# Patient Record
Sex: Male | Born: 1990 | Race: White | Hispanic: No | Marital: Single | Smoking: Never smoker
Health system: Southern US, Community
[De-identification: ages and names within clinical notes are randomized; demographics above are authoritative.]

## PROBLEM LIST (undated history)

## (undated) DIAGNOSIS — Z8781 Personal history of (healed) traumatic fracture: Secondary | ICD-10-CM

## (undated) HISTORY — DX: Personal history of (healed) traumatic fracture: Z87.81

## (undated) HISTORY — PX: HAND SURGERY: SHX662

---
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
## 2011-04-04 ENCOUNTER — Ambulatory Visit (INDEPENDENT_AMBULATORY_CARE_PROVIDER_SITE_OTHER): Payer: BC Managed Care – PPO | Admitting: Pediatrics

## 2011-04-04 VITALS — Wt 138.0 lb

## 2011-04-04 DIAGNOSIS — Z23 Encounter for immunization: Secondary | ICD-10-CM

## 2011-04-04 NOTE — Progress Notes (Signed)
Presented today for Hep B l vaccine.  Was sent by employer for booster vaccine since Hep B Ab level was 0.4 and immunity level was 0.99. Counseled that this level is not representative of his hep B immune status since he received the vaccines in 1993/1994 and titers are not routinely indicated after 3 doses of Hep B vaccine. Says he would like to follow the request of the employer and have titers done after a booster dose. No new questions on vaccine. Mom was counseled on risks benefits of vaccine and mom vaccine and mom verbalized understanding. Will return in 1-2 months for Hep B Ab titers. At that time he can receive his 2nd hep A and will draw LFT and HIV if requested.

## 2011-05-31 ENCOUNTER — Ambulatory Visit (INDEPENDENT_AMBULATORY_CARE_PROVIDER_SITE_OTHER): Payer: BC Managed Care – PPO | Admitting: Pediatrics

## 2011-05-31 DIAGNOSIS — Z23 Encounter for immunization: Secondary | ICD-10-CM

## 2011-05-31 DIAGNOSIS — Z003 Encounter for examination for adolescent development state: Secondary | ICD-10-CM

## 2011-06-03 NOTE — Progress Notes (Signed)
Seen for eval hepB titers low, [facility]. Given hepB booster, needs titer. Also getting flu shots and labs discussed. Has had 2 hep A

## 2012-01-08 ENCOUNTER — Encounter (HOSPITAL_BASED_OUTPATIENT_CLINIC_OR_DEPARTMENT_OTHER): Payer: Self-pay | Admitting: *Deleted

## 2012-01-08 ENCOUNTER — Emergency Department (HOSPITAL_BASED_OUTPATIENT_CLINIC_OR_DEPARTMENT_OTHER)
Admission: EM | Admit: 2012-01-08 | Discharge: 2012-01-08 | Disposition: A | Discharge status: Home / Self Care | Payer: BC Managed Care – PPO | Attending: Emergency Medicine | Admitting: Emergency Medicine

## 2012-01-08 DIAGNOSIS — R21 Rash and other nonspecific skin eruption: Secondary | ICD-10-CM | POA: Insufficient documentation

## 2012-01-08 MED ORDER — PERMETHRIN 5 % EX CREA: TOPICAL_CREAM | CUTANEOUS | Status: AC

## 2012-01-08 MED ORDER — DIPHENHYDRAMINE HCL 50 MG PO CAPS
50.0000 mg | ORAL_CAPSULE | Freq: Once | ORAL | Status: AC
  Administered 2012-01-08: 50 mg via ORAL
  Filled 2012-01-08: qty 1

## 2012-01-08 MED ORDER — DIPHENHYDRAMINE HCL 25 MG PO CAPS
ORAL_CAPSULE | ORAL | Status: AC
  Filled 2012-01-08: qty 2

## 2012-01-08 MED ORDER — DIPHENHYDRAMINE HCL 25 MG PO TABS: 50.0000 mg | ORAL_TABLET | Freq: Four times a day (QID) | ORAL | Status: DC

## 2012-01-08 NOTE — ED Notes (Signed)
Pt c/o rash to all extr. X 4 days

## 2012-01-08 NOTE — ED Provider Notes (Signed)
History     CSN: [id number]  Arrival date & time 01/08/12  2111   First MD Initiated Contact with Patient 01/08/12 2216      Chief Complaint  Patient presents with  . Rash    (Consider location/radiation/quality/duration/timing/severity/associated sxs/prior treatment) HPI Pt presenting with itchy rash over legs and arms.  Rash began last week- he first noted it on his lower legs in his boots.  He works as a Administrator.  Has tried calamine lotion, nail polish due to thought of chiggers, bleach- all without much help.  No sob, no lip/tongue swelling.   History reviewed. No pertinent past medical history.  History reviewed. No pertinent past surgical history.  History reviewed. No pertinent family history.  History  Substance Use Topics  . Smoking status: Never Smoker   . Smokeless tobacco: Not on file  . Alcohol Use: No      Review of Systems ROS reviewed and all otherwise negative except for mentioned in HPI  Allergies  Review of patient's allergies indicates no known allergies.  Home Medications   Current Outpatient Rx  Name Route Sig Dispense Refill  . CALAMINE EX LOTN Topical Apply 1 application topically as needed. Patient used this medication for itching.    Marland Kitchen CETIRIZINE HCL 10 MG PO TABS Oral Take 10 mg by mouth daily.    Marland Kitchen DIPHENHYDRAMINE HCL 25 MG PO TABS Oral Take 2 tablets (50 mg total) by mouth every 6 (six) hours. Take 1-2 tablets every 6 hours x 2 days, then space out to an as needed basis 20 tablet 0  . PERMETHRIN 5 % EX CREA  Apply to affected area once- leave on for 8-12 hours, then rinse off- may repeat x 1 60 g 0    BP 120/44  Pulse 63  Temp(Src) 98.2 F (36.8 C) (Oral)  Resp 16  Ht 5\' 9"  (1.753 m)  Wt 140 lb (63.504 kg)  BMI 20.67 kg/m2  SpO2 100% Vitals reviewed Physical Exam Physical Examination: General appearance - alert, well appearing, and in no distress Eyes - pupils equal and reactive, extraocular eye movements intact Mouth -  mucous membranes moist, pharynx normal without lesions Chest - clear to auscultation, no wheezes, rales or rhonchi, symmetric air entry Heart - normal rate, regular rhythm, normal S1, S2, no murmurs, rubs, clicks or gallops Abdomen - soft, nontender, nondistended, no masses or organomegaly Extremities - peripheral pulses normal, no pedal edema, no clubbing or cyanosis Skin - normal coloration and turgor, papular erythematous rash over arms and legs, chest- with overlying excorations.  No petechiae  ED Course  Procedures (including critical care time)  Labs Reviewed - No data to display No results found.   1. 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  Pt treated with benadryl and permethrin cream- for possibility of scabies.  He was counseled to try this treatment and arrange for follow up with primary care if rash does not improve.         Ethelda Chick, MD 01/11/12 [id number]

## 2012-01-08 NOTE — ED Notes (Signed)
Pt with le rash bilaterally, +uticaria, rash to bilateral le and bilateral upper extremeties, pt states that vesicles on arms are popping, pt has been applying calamine lotion with no relief

## 2012-01-08 NOTE — Discharge Instructions (Signed)
Return to the ED with any concerns including dificulty breathing, fever, pus draining from wounds, decreased level of alertness/lethargy, or any other alarming symptoms

## 2013-04-30 ENCOUNTER — Emergency Department (HOSPITAL_BASED_OUTPATIENT_CLINIC_OR_DEPARTMENT_OTHER)
Admission: EM | Admit: 2013-04-30 | Discharge: 2013-04-30 | Disposition: A | Discharge status: Home / Self Care | Payer: BC Managed Care – PPO | Attending: Emergency Medicine | Admitting: Emergency Medicine

## 2013-04-30 ENCOUNTER — Emergency Department (HOSPITAL_BASED_OUTPATIENT_CLINIC_OR_DEPARTMENT_OTHER): Payer: BC Managed Care – PPO

## 2013-04-30 ENCOUNTER — Encounter (HOSPITAL_BASED_OUTPATIENT_CLINIC_OR_DEPARTMENT_OTHER): Payer: Self-pay | Admitting: *Deleted

## 2013-04-30 DIAGNOSIS — S62308A Unspecified fracture of other metacarpal bone, initial encounter for closed fracture: Secondary | ICD-10-CM

## 2013-04-30 DIAGNOSIS — Z79899 Other long term (current) drug therapy: Secondary | ICD-10-CM | POA: Insufficient documentation

## 2013-04-30 DIAGNOSIS — Y929 Unspecified place or not applicable: Secondary | ICD-10-CM | POA: Insufficient documentation

## 2013-04-30 DIAGNOSIS — Y9389 Activity, other specified: Secondary | ICD-10-CM | POA: Insufficient documentation

## 2013-04-30 DIAGNOSIS — IMO0002 Reserved for concepts with insufficient information to code with codable children: Secondary | ICD-10-CM | POA: Insufficient documentation

## 2013-04-30 DIAGNOSIS — S62329A Displaced fracture of shaft of unspecified metacarpal bone, initial encounter for closed fracture: Secondary | ICD-10-CM | POA: Insufficient documentation

## 2013-04-30 MED ORDER — OXYCODONE-ACETAMINOPHEN 5-325 MG PO TABS: 1.0000 | ORAL_TABLET | Freq: Four times a day (QID) | ORAL | Status: DC | PRN

## 2013-04-30 NOTE — ED Notes (Signed)
Hit wall with right hand last pm having swelling some deformity noted postive pulses distally

## 2013-04-30 NOTE — ED Provider Notes (Signed)
CSN: [id number]     Arrival date & time 04/30/13  1224 History   First MD Initiated Contact with Patient 04/30/13 1254     Chief Complaint  Patient presents with  . Hand Pain   (Consider location/radiation/quality/duration/timing/severity/associated sxs/prior Treatment) Patient is a 22 y.o. male presenting with hand injury. The history is provided by the patient.  Hand Injury Location:  Hand Time since incident:  1 day Injury: yes   Mechanism of injury comment:  Punchged a wall Hand location:  R hand Pain details:    Quality:  Dull   Radiates to:  Does not radiate   Severity:  Mild   Onset quality:  Sudden   Duration:  1 day   Timing:  Constant   Progression:  Unchanged Chronicity:  New Handedness:  Right-handed Foreign body present:  No foreign bodies Tetanus status:  Up to date Prior injury to area:  No Relieved by:  Nothing Worsened by:  Nothing tried Ineffective treatments:  None tried Associated symptoms: no fever and no neck pain     History reviewed. No pertinent past medical history. History reviewed. No pertinent past surgical history. History reviewed. No pertinent family history. History  Substance Use Topics  . Smoking status: Never Smoker   . Smokeless tobacco: Not on file  . Alcohol Use: No    Review of Systems  Constitutional: Negative for fever.  HENT: Negative for rhinorrhea, drooling and neck pain.   Eyes: Negative for pain.  Respiratory: Negative for cough and shortness of breath.   Cardiovascular: Negative for chest pain and leg swelling.  Gastrointestinal: Negative for nausea, vomiting, abdominal pain and diarrhea.  Genitourinary: Negative for dysuria and hematuria.  Musculoskeletal: Negative for gait problem.  Skin: Negative for color change.  Neurological: Negative for numbness and headaches.  Hematological: Negative for adenopathy.  Psychiatric/Behavioral: Negative for behavioral problems.  All other systems reviewed and are  negative.    Allergies  Review of patient's allergies indicates no known allergies.  Home Medications   Current Outpatient Rx  Name  Route  Sig  Dispense  Refill  . calamine lotion   Topical   Apply 1 application topically as needed. Patient used this medication for itching.         . cetirizine (ZYRTEC) 10 MG tablet   Oral   Take 10 mg by mouth daily.         Marland Kitchen EXPIRED: diphenhydrAMINE (BENADRYL) 25 MG tablet   Oral   Take 2 tablets (50 mg total) by mouth every 6 (six) hours. Take 1-2 tablets every 6 hours x 2 days, then space out to an as needed basis   20 tablet   0    BP 133/64  Temp(Src) 98.4 F (36.9 C) (Oral)  SpO2 99% Physical Exam  Nursing note and vitals reviewed. Constitutional: He is oriented to person, place, and time. He appears well-developed and well-nourished.  HENT:  Head: Normocephalic and atraumatic.  Right Ear: External ear normal.  Left Ear: External ear normal.  Nose: Nose normal.  Mouth/Throat: Oropharynx is clear and moist. No oropharyngeal exudate.  Eyes: Conjunctivae and EOM are normal. Pupils are equal, round, and reactive to light.  Neck: Normal range of motion. Neck supple.  Cardiovascular: Normal rate, regular rhythm, normal heart sounds and intact distal pulses.  Exam reveals no gallop and no friction rub.   No murmur heard. Pulmonary/Chest: Effort normal and breath sounds normal. No respiratory distress. He has no wheezes.  Abdominal: Soft. Bowel  sounds are normal. He exhibits no distension. There is no tenderness. There is no rebound and no guarding.  Musculoskeletal: Normal range of motion. He exhibits no edema and no tenderness.  Mild swelling over right hand, 5th metacarpal area w/ mild to mod ttp.   Mild abrasion over 4th metacarpal head area.   Mild rotational deformity of 5th digit noted w/ digits of right hand flexed.   2+ distal pulses. No motor deficit noted in right hand.   Neurological: He is alert and oriented to  person, place, and time.  Skin: Skin is warm and dry.  Psychiatric: He has a normal mood and affect. His behavior is normal.    ED Course  Procedures (including critical care time) Labs Review Labs Reviewed - No data to display Imaging Review Dg Hand Complete Right  04/30/2013   CLINICAL DATA:  Patient states hit a wall last night. Posterior pain and swelling  EXAM: RIGHT HAND - COMPLETE 3+ VIEW  COMPARISON:  None.  FINDINGS: Three views of right hand submitted. There is mild angulated fracture mid shaft of right 5th metacarpal.  IMPRESSION: Mild angulated fracture of right 5th metacarpal mid shaft.   Electronically Signed   By: Natasha Mead   On: 04/30/2013 13:18    MDM   1. Closed fracture of 5th metacarpal, initial encounter    1:14 PM 22 y.o. male presents with right hand pain after punching a wall last night. Patient has a gross deformity to the area of the fifth metacarpal. He is otherwise neurovascularly intact on exam. Will get imaging. The patient refuses any pain medicine. Will speak to Hydrographic surveyor.  2:06 PM: Will place in ulnar gutter and try to manipulate splint to hold pressure on the fracture.  I have discussed the diagnosis/risks/treatment options with the patient and family and believe the pt to be eligible for discharge home to call Dr. Ronnell Freshwater office today so he can get early f/u next week. We also discussed returning to the ED immediately if new or worsening sx occur. We discussed the sx which are most concerning (e.g., worsening pain) that necessitate immediate return. Any new prescriptions provided to the patient are listed below.  Discharge Medication List as of 04/30/2013  2:08 PM    START taking these medications   Details  oxyCODONE-acetaminophen (PERCOCET) 5-325 MG per tablet Take 1 tablet by mouth every 6 (six) hours as needed for pain., Starting 04/30/2013, Until Discontinued, Print         Junius Argyle, MD 05/01/13 1355

## 2013-05-04 ENCOUNTER — Ambulatory Visit: Payer: Self-pay | Admitting: Pediatrics

## 2015-05-11 ENCOUNTER — Encounter (HOSPITAL_BASED_OUTPATIENT_CLINIC_OR_DEPARTMENT_OTHER): Payer: Self-pay | Admitting: Emergency Medicine

## 2015-05-11 ENCOUNTER — Emergency Department (HOSPITAL_BASED_OUTPATIENT_CLINIC_OR_DEPARTMENT_OTHER)
Admission: EM | Admit: 2015-05-11 | Discharge: 2015-05-11 | Disposition: A | Discharge status: Home / Self Care | Payer: BLUE CROSS/BLUE SHIELD | Attending: Emergency Medicine | Admitting: Emergency Medicine

## 2015-05-11 DIAGNOSIS — W228XXA Striking against or struck by other objects, initial encounter: Secondary | ICD-10-CM | POA: Diagnosis not present

## 2015-05-11 DIAGNOSIS — Z79899 Other long term (current) drug therapy: Secondary | ICD-10-CM | POA: Diagnosis not present

## 2015-05-11 DIAGNOSIS — Y998 Other external cause status: Secondary | ICD-10-CM | POA: Diagnosis not present

## 2015-05-11 DIAGNOSIS — Y9389 Activity, other specified: Secondary | ICD-10-CM | POA: Insufficient documentation

## 2015-05-11 DIAGNOSIS — Y9289 Other specified places as the place of occurrence of the external cause: Secondary | ICD-10-CM | POA: Insufficient documentation

## 2015-05-11 DIAGNOSIS — S0501XA Injury of conjunctiva and corneal abrasion without foreign body, right eye, initial encounter: Secondary | ICD-10-CM | POA: Diagnosis not present

## 2015-05-11 DIAGNOSIS — S0591XA Unspecified injury of right eye and orbit, initial encounter: Secondary | ICD-10-CM | POA: Diagnosis present

## 2015-05-11 MED ORDER — POLYMYXIN B-TRIMETHOPRIM 10000-0.1 UNIT/ML-% OP SOLN: 1.0000 [drp] | OPHTHALMIC | Status: DC

## 2015-05-11 MED ORDER — TETRACAINE HCL 0.5 % OP SOLN
2.0000 [drp] | Freq: Once | OPHTHALMIC | Status: AC
  Administered 2015-05-11: 2 [drp] via OPHTHALMIC
  Filled 2015-05-11: qty 2

## 2015-05-11 MED ORDER — FLUORESCEIN SODIUM 1 MG OP STRP
1.0000 | ORAL_STRIP | Freq: Once | OPHTHALMIC | Status: AC
  Administered 2015-05-11: 1 via OPHTHALMIC
  Filled 2015-05-11: qty 1

## 2015-05-11 NOTE — Discharge Instructions (Signed)
Make an appointment to see the ophthalmologist with contact information below. Do not hesitate to return to the emergency room for any new, worsening or concerning symptoms.  Carmela Rima [address] [place] [address] [phone]   Corneal Abrasion The cornea is the clear covering at the front and center of the eye. When looking at the colored portion of the eye (iris), you are looking through the cornea. This very thin tissue is made up of many layers. The surface layer is a single layer of cells (corneal epithelium) and is one of the most sensitive tissues in the body. If a scratch or injury causes the corneal epithelium to come off, it is called a corneal abrasion. If the injury extends to the tissues below the epithelium, the condition is called a corneal ulcer. CAUSES   Scratches.  Trauma.  Foreign body in the eye. Some people have recurrences of abrasions in the area of the original injury even after it has healed (recurrent erosion syndrome). Recurrent erosion syndrome generally improves and goes away with time. SYMPTOMS   Eye pain.  Difficulty or inability to keep the injured eye open.  The eye becomes very sensitive to light.  Recurrent erosions tend to happen suddenly, first thing in the morning, usually after waking up and opening the eye. DIAGNOSIS  Your health care provider can diagnose a corneal abrasion during an eye exam. Dye is usually placed in the eye using a drop or a small paper strip moistened by your tears. When the eye is examined with a special light, the abrasion shows up clearly because of the dye. TREATMENT   Small abrasions may be treated with antibiotic drops or ointment alone.  A pressure patch may be put over the eye. If this is done, follow your doctor's instructions for when to remove the patch. Do not drive or use machines while the eye patch is on. Judging distances is hard to do with a patch on. If the abrasion becomes  infected and spreads to the deeper tissues of the cornea, a corneal ulcer can result. This is serious because it can cause corneal scarring. Corneal scars interfere with light passing through the cornea and cause a loss of vision in the involved eye. HOME CARE INSTRUCTIONS  Use medicine or ointment as directed. Only take over-the-counter or prescription medicines for pain, discomfort, or fever as directed by your health care provider.  Do not drive or operate machinery if your eye is patched. Your ability to judge distances is impaired.  If your health care provider has given you a follow-up appointment, it is very important to keep that appointment. Not keeping the appointment could result in a severe eye infection or permanent loss of vision. If there is any problem keeping the appointment, let your health care provider know. SEEK MEDICAL CARE IF:   You have pain, light sensitivity, and a scratchy feeling in one eye or both eyes.  Your pressure patch keeps loosening up, and you can blink your eye under the patch after treatment.  Any kind of discharge develops from the eye after treatment or if the lids stick together in the morning.  You have the same symptoms in the morning as you did with the original abrasion days, weeks, or months after the abrasion healed. MAKE SURE YOU:   Understand these instructions.  Will watch your condition.  Will get help right away if you are not doing well or get worse. Document Released: 08/02/2000 Document Revised: 08/10/2013  Document Reviewed: 04/12/2013 [facility] Patient Information 2015 [place], [place]. This information is not intended to replace advice given to you by your health care provider. Make sure you discuss any questions you have with your health care provider.

## 2015-05-11 NOTE — ED Provider Notes (Signed)
CSN: [id number]     Arrival date & time 05/11/15  1810 History   First MD Initiated Contact with Patient 05/11/15 1816     Chief Complaint  Patient presents with  . Eye Injury     (Consider location/radiation/quality/duration/timing/severity/associated sxs/prior Treatment) HPI   Blood pressure 131/73, pulse 74, temperature 98.4 F (36.9 C), temperature source Oral, resp. rate 16, height  (1.702 m), weight 160 lb (72.576 kg), SpO2 100 %.  Phillip Cabrera is a 24 y.o. male complaining of right eye injury. At work today, weed eating, when a rock hit him in the eye. Rock hit the globe directly, he was not wearing safety glasses at the time. Following initial impact, had difficulty seeing out of right eye for 15 minutes, and some associated nausea. Now reports blurry vision in peripheral visual fields. Pain is 3/10 with intermittent 5/10 throbbing sensation. Slight photosensitivity and positive foreign body sensation. No relief of symptoms with rinsing the eye. Currently denies headache, nausea, or vomiting. He does not wear glasses or contacts.   History reviewed. No pertinent past medical history. History reviewed. No pertinent past surgical history. No family history on file. Social History  Substance Use Topics  . Smoking status: Never Smoker   . Smokeless tobacco: None  . Alcohol Use: No    Review of Systems  10 systems reviewed and found to be negative, except as noted in the HPI.  Allergies  Review of patient's allergies indicates no known allergies.  Home Medications   Prior to Admission medications   Medication Sig Start Date End Date Taking? Authorizing Provider  calamine lotion Apply 1 application topically as needed. Patient used this medication for itching.    Historical Provider, MD  cetirizine (ZYRTEC) 10 MG tablet Take 10 mg by mouth daily.    Historical Provider, MD  diphenhydrAMINE (BENADRYL) 25 MG tablet Take 2 tablets (50 mg total) by mouth every 6 (six)  hours. Take 1-2 tablets every 6 hours x 2 days, then space out to an as needed basis 01/08/12 02/07/12  Jerelyn Scott, MD  oxyCODONE-acetaminophen (PERCOCET) 5-325 MG per tablet Take 1 tablet by mouth every 6 (six) hours as needed for pain. 04/30/13   Purvis Sheffield, MD  trimethoprim-polymyxin b (POLYTRIM) ophthalmic solution Place 1 drop into the right eye every 4 (four) hours. 05/11/15   Nicole Pisciotta, PA-C   BP 131/73 mmHg  Pulse 74  Temp(Src) 98.4 F (36.9 C) (Oral)  Resp 16  Ht  (1.702 m)  Wt 160 lb (72.576 kg)  BMI 25.05 kg/m2  SpO2 100% Physical Exam  Constitutional: He is oriented to person, place, and time. He appears well-developed and well-nourished. No distress.  HENT:  Head: Normocephalic and atraumatic.  Mouth/Throat: Oropharynx is clear and moist.  Eyes: EOM and lids are normal. Pupils are equal, round, and reactive to light. Lids are everted and swept, no foreign bodies found. Right conjunctiva is injected.  Slit lamp exam:      The right eye shows corneal abrasion.    Conjunctival injection noted in right eye. 20/40 right, 20/20 left. EOMI with no pain. Decreased right temporal visual fields.  Fluorescein eye stain reveals corneal abrasion, anterior chamber otherwise unremarkable.   Neck: Normal range of motion.  Cardiovascular: Normal rate, regular rhythm and intact distal pulses.   Pulmonary/Chest: Effort normal and breath sounds normal. No stridor.  Abdominal: Soft. There is no tenderness.  Musculoskeletal: Normal range of motion.  Neurological: He is alert and oriented to  person, place, and time.  Skin: He is not diaphoretic.  Psychiatric: He has a normal mood and affect.  Nursing note and vitals reviewed.   ED Course  Procedures (including critical care time) Labs Review Labs Reviewed - No data to display  Imaging Review No results found. I have personally reviewed and evaluated these images and lab results as part of my medical  decision-making.   EKG Interpretation None      MDM   Final diagnoses:  Corneal abrasion, right, initial encounter    Filed Vitals:   05/11/15 1814  BP: 131/73  Pulse: 74  Temp: 98.4 F (36.9 C)  TempSrc: Oral  Resp: 16  Height:  (1.702 m)  Weight: 160 lb (72.576 kg)  SpO2: 100%    Medications  fluorescein ophthalmic strip 1 strip (1 strip Both Eyes Given 05/11/15 1836)  tetracaine (PONTOCAINE) 0.5 % ophthalmic solution 2 drop (2 drops Right Eye Given 05/11/15 1900)    Phillip Cabrera is a pleasant 24 y.o. male presenting with pain foreign body sensation and blurred vision to right eye after patient was weed eating and felt an impact on the right. Patient has visual acuity of 20/40. Slit lamp exam reveals a corneal abrasion right on the visual axis. Patient will be given prescription for Polytrim and advised to follow closely with ophthalmology.  Evaluation does not show pathology that would require ongoing emergent intervention or inpatient treatment. Pt is hemodynamically stable and mentating appropriately. Discussed findings and plan with patient/guardian, who agrees with care plan. All questions answered. Return precautions discussed and outpatient follow up given.   New Prescriptions   TRIMETHOPRIM-POLYMYXIN B (POLYTRIM) OPHTHALMIC SOLUTION    Place 1 drop into the right eye every 4 (four) hours.         Wynetta Emery, PA-C 05/11/15 1944  Geoffery Lyons, MD 05/11/15 2011

## 2015-05-11 NOTE — ED Notes (Signed)
Felt like something in eye after weed eating today. Redness noted. Pt has rinsed eye out.

## 2016-08-09 ENCOUNTER — Encounter (HOSPITAL_BASED_OUTPATIENT_CLINIC_OR_DEPARTMENT_OTHER): Payer: Self-pay | Admitting: *Deleted

## 2016-08-09 ENCOUNTER — Emergency Department (HOSPITAL_BASED_OUTPATIENT_CLINIC_OR_DEPARTMENT_OTHER)
Admission: EM | Admit: 2016-08-09 | Discharge: 2016-08-09 | Disposition: A | Discharge status: Home / Self Care | Payer: BLUE CROSS/BLUE SHIELD | Attending: Emergency Medicine | Admitting: Emergency Medicine

## 2016-08-09 DIAGNOSIS — K529 Noninfective gastroenteritis and colitis, unspecified: Secondary | ICD-10-CM | POA: Diagnosis not present

## 2016-08-09 DIAGNOSIS — R112 Nausea with vomiting, unspecified: Secondary | ICD-10-CM | POA: Diagnosis present

## 2016-08-09 DIAGNOSIS — F17290 Nicotine dependence, other tobacco product, uncomplicated: Secondary | ICD-10-CM | POA: Diagnosis not present

## 2016-08-09 LAB — COMPREHENSIVE METABOLIC PANEL
ALK PHOS: 62 U/L (ref 38–126)
ALT: 20 U/L (ref 17–63)
AST: 29 U/L (ref 15–41)
Albumin: 4.6 g/dL (ref 3.5–5.0)
Anion gap: 11 (ref 5–15)
BILIRUBIN TOTAL: 1.1 mg/dL (ref 0.3–1.2)
BUN: 18 mg/dL (ref 6–20)
CALCIUM: 9.7 mg/dL (ref 8.9–10.3)
CO2: 23 mmol/L (ref 22–32)
CREATININE: 0.92 mg/dL (ref 0.61–1.24)
Chloride: 105 mmol/L (ref 101–111)
GFR calc Af Amer: >60 mL/min (ref >60)
GLUCOSE: 107 mg/dL — AB (ref 65–99)
Potassium: 3.5 mmol/L (ref 3.5–5.1)
Sodium: 139 mmol/L (ref 135–145)
TOTAL PROTEIN: 7.3 g/dL (ref 6.5–8.1)

## 2016-08-09 LAB — CBC WITH DIFFERENTIAL/PLATELET
BASOS ABS: 0 10*3/uL (ref 0.0–0.1)
BASOS PCT: 0 %
EOS PCT: 0 %
Eosinophils Absolute: 0 10*3/uL (ref 0.0–0.7)
HCT: 44.6 % (ref 39.0–52.0)
Hemoglobin: 15.4 g/dL (ref 13.0–17.0)
Lymphocytes Relative: 4 %
Lymphs Abs: 0.4 10*3/uL — ABNORMAL LOW (ref 0.7–4.0)
MCH: 30.9 pg (ref 26.0–34.0)
MCHC: 34.5 g/dL (ref 30.0–36.0)
MCV: 89.4 fL (ref 78.0–100.0)
MONO ABS: 0.5 10*3/uL (ref 0.1–1.0)
MONOS PCT: 4 %
Neutro Abs: 10.8 10*3/uL — ABNORMAL HIGH (ref 1.7–7.7)
Neutrophils Relative %: 92 %
PLATELETS: 210 10*3/uL (ref 150–400)
RBC: 4.99 MIL/uL (ref 4.22–5.81)
RDW: 11.4 % — AB (ref 11.5–15.5)
WBC: 11.8 10*3/uL — ABNORMAL HIGH (ref 4.0–10.5)

## 2016-08-09 LAB — LIPASE, BLOOD: LIPASE: 19 U/L (ref 11–51)

## 2016-08-09 MED ORDER — SODIUM CHLORIDE 0.9 % IV BOLUS (SEPSIS)
2000.0000 mL | Freq: Once | INTRAVENOUS | Status: AC
  Administered 2016-08-09: 2000 mL via INTRAVENOUS

## 2016-08-09 MED ORDER — ONDANSETRON HCL 4 MG/2ML IJ SOLN
4.0000 mg | Freq: Once | INTRAMUSCULAR | Status: AC
  Administered 2016-08-09: 4 mg via INTRAVENOUS
  Filled 2016-08-09: qty 2

## 2016-08-09 MED ORDER — ONDANSETRON HCL 4 MG PO TABS: 4.0000 mg | ORAL_TABLET | Freq: Three times a day (TID) | ORAL | 0 refills | Status: DC | PRN

## 2016-08-09 MED ORDER — ONDANSETRON 4 MG PO TBDP
4.0000 mg | ORAL_TABLET | Freq: Once | ORAL | Status: AC | PRN
  Administered 2016-08-09: 4 mg via ORAL
  Filled 2016-08-09: qty 1

## 2016-08-09 NOTE — ED Notes (Signed)
ED Provider at bedside. 

## 2016-08-09 NOTE — ED Notes (Signed)
Pt verbalizes understanding of d/c instructions and denies any further needs at this time. 

## 2016-08-09 NOTE — ED Provider Notes (Signed)
[facility] Provider Note   CSN: [id number] Arrival date & time: 08/09/16  1713  By signing my name below, I, Phillip Cabrera, attest that this documentation has been prepared under the direction and in the presence of physician practitioner, Phillip LovelessScott Eligah Anello, MD. Electronically Signed: Linna Darnerussell Cabrera, Scribe. 08/09/2016. 7:18 PM.  History   Chief Complaint Chief Complaint  Patient presents with  . Shortness of Breath  . Emesis    The history is provided by the patient. No language interpreter was used.     HPI Comments: Phillip Cabrera is a 25 y.o. male who presents to the Emergency Department complaining of an insect bite occurring around 10 AM this morning. He states he was mowing lawns and felt a biting/stinging sensation to his right thigh; he notes he did not see the insect. He reports he had some mild pain and swelling to his right thigh afterwards but these symptoms have resolved. He states that a few hours after he was bitten, he developed intermittent nausea, vomiting, diarrhea, dizziness, generalized myalgias, abdominal cramps, chills, SOB, and tingling in his upper extremities. He notes he feels dizzy currently. He states he has had "a bunch" of episodes of vomiting and diarrhea this afternoon. He states his abdominal pain is intermittent and he reports diaphoresis secondary to his vomiting. He states he was short of breath and had tingling in his upper extremities in the waiting room tonight but these symptoms have since resolved. No recent unusual food intake. He denies dysuria, hematuria, fever, cough, leg swelling, hematemesis, or any other associated symptoms.  History reviewed. No pertinent past medical history.  Patient Active Problem List   Diagnosis Date Noted  . Closed fracture of 5th metacarpal 04/30/2013    History reviewed. No pertinent surgical history.     Home Medications    Prior to Admission medications   Medication Sig Start Date End Date  Taking? Authorizing Provider  calamine lotion Apply 1 application topically as needed. Patient used this medication for itching.    Historical Provider, MD  cetirizine (ZYRTEC) 10 MG tablet Take 10 mg by mouth daily.    Historical Provider, MD  diphenhydrAMINE (BENADRYL) 25 MG tablet Take 2 tablets (50 mg total) by mouth every 6 (six) hours. Take 1-2 tablets every 6 hours x 2 days, then space out to an as needed basis 01/08/12 02/07/12  Phillip ScottMartha Linker, MD  ondansetron (ZOFRAN) 4 MG tablet Take 1 tablet (4 mg total) by mouth every 8 (eight) hours as needed for nausea or vomiting. 08/09/16   Phillip LovelessScott Phillip Lacap, MD  oxyCODONE-acetaminophen (PERCOCET) 5-325 MG per tablet Take 1 tablet by mouth every 6 (six) hours as needed for pain. 04/30/13   Purvis SheffieldForrest Harrison, MD  trimethoprim-polymyxin b (POLYTRIM) ophthalmic solution Place 1 drop into the right eye every 4 (four) hours. 05/11/15   Phillip ReiningNicole Pisciotta, PA-C    Family History No family history on file.  Social History Social History  Substance Use Topics  . Smoking status: Never Smoker  . Smokeless tobacco: Current User    Types: Snuff  . Alcohol use Yes     Comment: occasional     Allergies   Patient has no known allergies.   Review of Systems Review of Systems  Constitutional: Positive for chills and diaphoresis. Negative for fever.  Respiratory: Positive for shortness of breath. Negative for cough.   Cardiovascular: Negative for leg swelling.  Gastrointestinal: Positive for abdominal pain, diarrhea, nausea and vomiting.       Negative for  hematemesis.  Genitourinary: Negative for dysuria and hematuria.  Musculoskeletal: Positive for joint swelling (right thigh, resolved) and myalgias (generalized).  Skin: Positive for wound (insect bite).  Neurological: Positive for dizziness and numbness (tingling in upper extremities).  All other systems reviewed and are negative.    Physical Exam Updated Vital Signs BP (!) 121/53   Pulse 83    Temp 98.8 F (37.1 C)   Resp 18   Ht 5\' 8"  (1.727 m)   Wt 155 lb (70.3 kg)   SpO2 100%   BMI 23.57 kg/m   Physical Exam  Constitutional: He is oriented to person, place, and time. He appears well-developed and well-nourished. No distress.  HENT:  Head: Normocephalic and atraumatic.  Right Ear: External ear normal.  Left Ear: External ear normal.  Nose: Nose normal.  Eyes: Right eye exhibits no discharge. Left eye exhibits no discharge.  Neck: Neck supple.  Cardiovascular: Normal rate, regular rhythm and normal heart sounds.   Pulmonary/Chest: Effort normal and breath sounds normal. He has no wheezes. He has no rales.  Abdominal: Soft. There is tenderness.  Mild, generalized, non-focal tenderness  Musculoskeletal: He exhibits no edema.  No visible lesions, swelling, or tenderness in the right thigh.  Neurological: He is alert and oriented to person, place, and time.  Skin: Skin is warm and dry. He is not diaphoretic.  Nursing note and vitals reviewed.    ED Treatments / Results  Labs (all labs ordered are listed, but only abnormal results are displayed) Labs Reviewed  COMPREHENSIVE METABOLIC PANEL - Abnormal; Notable for the following:       Result Value   Glucose, Bld 107 (*)    All other components within normal limits  CBC WITH DIFFERENTIAL/PLATELET - Abnormal; Notable for the following:    WBC 11.8 (*)    RDW 11.4 (*)    Neutro Abs 10.8 (*)    Lymphs Abs 0.4 (*)    All other components within normal limits  LIPASE, BLOOD    EKG  EKG Interpretation None       Radiology No results found.  Procedures Procedures (including critical care time)  DIAGNOSTIC STUDIES: Oxygen Saturation is 100% on RA, normal by my interpretation.    COORDINATION OF CARE: 7:27 PM Discussed treatment plan with pt at bedside and pt agreed to plan.  Medications Ordered in ED Medications  ondansetron (ZOFRAN-ODT) disintegrating tablet 4 mg (4 mg Oral Given 08/09/16 1736)    sodium chloride 0.9 % bolus 2,000 mL (0 mLs Intravenous Stopped 08/09/16 2107)  ondansetron (ZOFRAN) injection 4 mg (4 mg Intravenous Given 08/09/16 1940)     Initial Impression / Assessment and Plan / ED Course  I have reviewed the triage vital signs and the nursing notes.  Pertinent labs & imaging results that were available during my care of the patient were reviewed by me and considered in my medical decision making (see chart for details).  Clinical Course as of Aug 10 8  Fri Aug 09, 2016  1927 No obvious lesions on R leg, likely inconsequential bug bite. Fluids for what is likely a viral gastroenteritis. No current dyspnea, unclear what to make of transient dyspnea, probably related to the vomiting/diarrhea/dehydration.  [SG]  2055 Feels better, d/c home  [SG]    Clinical Course User Index [SG] Phillip LovelessScott Jerold Yoss, MD    Abdominal exam has only mild tenderness that is probably from vomiting/diarrhea. Highly doubt acute abdominal emergency. Better after fluids. Unclear why he had transient dyspnea  but with no current symptoms, no CP, clear lungs and no hypoxia I don't think further workup needed now. Could have been from acute dehydration. Zofran for discharge, push oral fluids, discussed return precautions.   Final Clinical Impressions(s) / ED Diagnoses   Final diagnoses:  Gastroenteritis    New Prescriptions Discharge Medication List as of 08/09/2016  8:54 PM    START taking these medications   Details  ondansetron (ZOFRAN) 4 MG tablet Take 1 tablet (4 mg total) by mouth every 8 (eight) hours as needed for nausea or vomiting., Starting Fri 08/09/2016, Print       I personally performed the services described in this documentation, which was scribed in my presence. The recorded information has been reviewed and is accurate.    Phillip Loveless, MD 08/10/16 0010

## 2016-08-09 NOTE — ED Triage Notes (Signed)
Pt reports he had on "holey jeans and something bit him". C/o pain and swelling to right thigh. Also reports he vomited 5 times and has had diarrhea since then. Pt states he feels SOB. Hands cramping in triage

## 2018-06-10 ENCOUNTER — Other Ambulatory Visit: Payer: Self-pay

## 2018-06-10 ENCOUNTER — Emergency Department (HOSPITAL_BASED_OUTPATIENT_CLINIC_OR_DEPARTMENT_OTHER)
Admission: EM | Admit: 2018-06-10 | Discharge: 2018-06-10 | Disposition: A | Discharge status: Home / Self Care | Payer: Worker's Compensation | Attending: Emergency Medicine | Admitting: Emergency Medicine

## 2018-06-10 ENCOUNTER — Encounter (HOSPITAL_BASED_OUTPATIENT_CLINIC_OR_DEPARTMENT_OTHER): Payer: Self-pay

## 2018-06-10 ENCOUNTER — Emergency Department (HOSPITAL_BASED_OUTPATIENT_CLINIC_OR_DEPARTMENT_OTHER): Payer: Worker's Compensation

## 2018-06-10 DIAGNOSIS — W01198A Fall on same level from slipping, tripping and stumbling with subsequent striking against other object, initial encounter: Secondary | ICD-10-CM | POA: Insufficient documentation

## 2018-06-10 DIAGNOSIS — Y929 Unspecified place or not applicable: Secondary | ICD-10-CM | POA: Insufficient documentation

## 2018-06-10 DIAGNOSIS — Y9389 Activity, other specified: Secondary | ICD-10-CM | POA: Insufficient documentation

## 2018-06-10 DIAGNOSIS — Y998 Other external cause status: Secondary | ICD-10-CM | POA: Diagnosis not present

## 2018-06-10 DIAGNOSIS — Z79899 Other long term (current) drug therapy: Secondary | ICD-10-CM | POA: Diagnosis not present

## 2018-06-10 DIAGNOSIS — W19XXXA Unspecified fall, initial encounter: Secondary | ICD-10-CM

## 2018-06-10 DIAGNOSIS — S62232A Other displaced fracture of base of first metacarpal bone, left hand, initial encounter for closed fracture: Secondary | ICD-10-CM | POA: Insufficient documentation

## 2018-06-10 DIAGNOSIS — S6992XA Unspecified injury of left wrist, hand and finger(s), initial encounter: Secondary | ICD-10-CM | POA: Diagnosis present

## 2018-06-10 MED ORDER — OXYCODONE-ACETAMINOPHEN 5-325 MG PO TABS: 1.0000 | ORAL_TABLET | Freq: Four times a day (QID) | ORAL | 0 refills | Status: DC | PRN

## 2018-06-10 MED ORDER — NAPROXEN 500 MG PO TABS: 500.0000 mg | ORAL_TABLET | Freq: Two times a day (BID) | ORAL | 0 refills | Status: DC

## 2018-06-10 MED ORDER — NAPROXEN 250 MG PO TABS
500.0000 mg | ORAL_TABLET | Freq: Once | ORAL | Status: AC
  Administered 2018-06-10: 500 mg via ORAL
  Filled 2018-06-10: qty 2

## 2018-06-10 NOTE — ED Triage Notes (Addendum)
Pt states he fell at work ~3pm-pain to left thumb and right shoulder-NAD-steady gait

## 2018-06-10 NOTE — Discharge Instructions (Signed)
Please read and follow all provided instructions.  You have been seen today for a fall and found to have a fracture of the base of the first metacarpal of your left hand, otherwise noticed a fracture to the base of the thumb.  We have placed you in a splint, it is important that you keep your hand in the splint until you have seen hand surgeon Dr. Mina Marble.  Please call his office tomorrow morning in order to schedule appointment within the next 1 week.  In the interim please follow the instructions below.  Tests performed today include: An x-ray of the affected area - does NOT show any broken bones or dislocations.  Vital signs. See below for your results today.   Home care instructions: -- *PRICE in the first 24-48 hours after injury: Protect (with brace, splint, sling), if given by your provider- keep this on until you have seen Dr. Clarisse Gouge- Apply to outside of splint wrapped in a towel. Apply ice for 20 min, then remove for 40 min while awake Compression- Wear brace, elastic bandage, splint as directed by your provider Elevate affected extremity above the level of your heart when not walking around for the first 24-48 hours   Medications:  - Naproxen is a nonsteroidal anti-inflammatory medication that will help with pain and swelling. Be sure to take this medication as prescribed with food, 1 pill every 12 hours,  It should be taken with food, as it can cause stomach upset, and more seriously, stomach bleeding. Do not take other nonsteroidal anti-inflammatory medications with this such as Advil, Motrin, Aleve, Mobic, Goodie Powder, or Motrin.    --Percocet-this is a narcotic/controlled substance medication that has potential addicting qualities.  We recommend that you take 1 tablet every 6 hours as needed for severe pain.  Do not drive or operate heavy machinery when taking this medicine as it can be sedating. Do not drink alcohol or take other sedating medications when taking this  medicine for safety reasons.  Keep this out of reach of small children.  Please be aware this medicine has Tylenol in it (325 mg/tab) do not exceed the maximum dose of Tylenol in a day per over the counter recommendations should you decide to supplement with Tylenol over the counter.   We have prescribed you new medication(s) today. Discuss the medications prescribed today with your pharmacist as they can have adverse effects and interactions with your other medicines including over the counter and prescribed medications. Seek medical evaluation if you start to experience new or abnormal symptoms after taking one of these medicines, seek care immediately if you start to experience difficulty breathing, feeling of your throat closing, facial swelling, or rash as these could be indications of a more serious allergic reaction  Return instructions:  Please return if your digits or extremity are numb or tingling, appear gray or blue, or you have severe pain (also elevate the extremity and loosen splint or wrap if you were given one) Please return if you have redness or fevers.  Please return to the Emergency Department if you experience worsening symptoms.  Please return if you have any other emergent concerns. Additional Information:  Your vital signs today were: BP 131/64 (BP Location: Left Arm)    Pulse 65    Temp 97.9 F (36.6 C) (Oral)    Resp 18    Ht 5\' 8"  (1.727 m)    Wt 76.2 kg    SpO2 100%    BMI 25.54  kg/m  If your blood pressure (BP) was elevated above 135/85 this visit, please have this repeated by your doctor within one month. ---------------

## 2018-06-10 NOTE — ED Provider Notes (Signed)
[facility] EMERGENCY DEPARTMENT Provider Note   CSN: [id number] Arrival date & time: 06/10/18  1710     History   Chief Complaint Chief Complaint  Patient presents with  . Fall    HPI Phillip Cabrera is a 27 y.o. male without significant past medical history who presents to the emergency department status post mechanical fall this afternoon with complaints of left thumb pain as well as right shoulder pain.  Patient states that he was walking and following a lawnmower, he grabbed onto the handle and it pulled him forward causing him to fall.  He states that he fell onto his right elbow and rolled over onto his left thumb.  He denies head injury or loss of consciousness.  He states that he is only having pain to the left thumb and the right shoulder area, however the pain is most significant to the thumb.  He states it is a 3 out of 10 at rest that worsens with movement.  No other specific alleviating or aggravating factors.  Denies headache, neck pain, back pain, numbness, weakness, paresthesias.  Denies open wounds.  Patient is right-hand dominant.  HPI  History reviewed. No pertinent past medical history.  Patient Active Problem List   Diagnosis Date Noted  . Closed fracture of 5th metacarpal 04/30/2013    Past Surgical History:  Procedure Laterality Date  . HAND SURGERY          Home Medications    Prior to Admission medications   Medication Sig Start Date End Date Taking? Authorizing Provider  calamine lotion Apply 1 application topically as needed. Patient used this medication for itching.    [provider]  cetirizine (ZYRTEC) 10 MG tablet Take 10 mg by mouth daily.    [provider]  diphenhydrAMINE (BENADRYL) 25 MG tablet Take 2 tablets (50 mg total) by mouth every 6 (six) hours. Take 1-2 tablets every 6 hours x 2 days, then space out to an as needed basis 01/08/12 02/07/12  Mabe, Latanya Maudlin, MD  ondansetron (ZOFRAN) 4 MG tablet Take 1 tablet  (4 mg total) by mouth every 8 (eight) hours as needed for nausea or vomiting. 08/09/16   Pricilla Loveless, MD    Family History No family history on file.  Social History Social History   Tobacco Use  . Smoking status: Never Smoker  . Smokeless tobacco: Former Neurosurgeon    Types: Snuff  Substance Use Topics  . Alcohol use: Not Currently  . Drug use: No     Allergies   Patient has no known allergies.   Review of Systems Review of Systems  Constitutional: Negative for chills and fever.  Respiratory: Negative for shortness of breath.   Cardiovascular: Negative for chest pain.  Gastrointestinal: Negative for abdominal pain.  Musculoskeletal: Positive for arthralgias (R shoulder and L thumb). Negative for back pain and neck pain.  Skin: Negative for rash and wound.  Neurological: Negative for weakness, numbness and headaches.     Physical Exam Updated Vital Signs BP 131/64 (BP Location: Left Arm)   Pulse 65   Temp 97.9 F (36.6 C) (Oral)   Resp 18   Ht 5\' 8"  (1.727 m)   Wt 76.2 kg   SpO2 100%   BMI 25.54 kg/m   Physical Exam  Constitutional: He appears well-developed and well-nourished.  Non-toxic appearance. No distress.  HENT:  Head: Normocephalic and atraumatic.  Right Ear: No hemotympanum.  Left Ear: No hemotympanum.  Nose: Nose normal.  Mouth/Throat: Uvula is midline.  Eyes: Pupils are equal, round, and reactive to light. Conjunctivae and EOM are normal. Right eye exhibits no discharge. Left eye exhibits no discharge.  Neck: Normal range of motion. Neck supple. No spinous process tenderness present.  Cardiovascular: Normal rate and regular rhythm.  Pulses:      Radial pulses are 2+ on the right side, and 2+ on the left side.  Pulmonary/Chest: Effort normal and breath sounds normal. No respiratory distress. He has no wheezes. He has no rhonchi. He has no rales.  Respiration even and unlabored  Abdominal: Soft. He exhibits no distension. There is no tenderness.    Musculoskeletal:  No obvious deformity, appreciable swelling, erythema, ecchymosis, or significant open wounds Back: No midline tenderness to palpation.  No palpable step-off. Upper extremities: Patient has full active range of motion to bilateral shoulders, elbows, wrist, and all digits.  He does have pain with left thumb flexion, extension, and abduction.  Patient with some tenderness over the diffuse right shoulder and right posterior elbow.  No point/focal tenderness to these areas.  No tenderness the medial/lateral epicondyles or the olecranon or of the radial head.  The right upper extremity is otherwise nontender.  Patient does have tenderness to palpation to the left 1st phalanges, IP joint, MCP, as well as the first metacarpal and anatomical snuffbox area.  No tenderness to the distal radius or ulna.  Left upper extremities otherwise nontender.  He is neurovascularly intact distal to all areas of discomfort. Lower extremities: Normal range of motion.  Nontender.  Neurological: He is alert.  Sensation grossly intact bilateral upper extremities.  5 out of 5 symmetric grip strength.  Patient able to perform okay sign, thumbs up, and cross second/third digits bilaterally.Clear speech.   Skin: Skin is warm and dry. Capillary refill takes less than 2 seconds. No rash noted.  Psychiatric: He has a normal mood and affect. His behavior is normal.  Nursing note and vitals reviewed.  ED Treatments / Results  Labs (all labs ordered are listed, but only abnormal results are displayed) Labs Reviewed - No data to display  EKG None  Radiology Dg Shoulder Right  Result Date: 06/10/2018 CLINICAL DATA:  Fall with right shoulder pain. EXAM: RIGHT SHOULDER - 2+ VIEW COMPARISON:  None. FINDINGS: There is no evidence of fracture or dislocation. There is no evidence of arthropathy or other focal bone abnormality. Soft tissues are unremarkable. IMPRESSION: Negative. Electronically Signed   By: Elberta Fortis  M.D.   On: 06/10/2018 18:28   Dg Elbow Complete Right  Result Date: 06/10/2018 CLINICAL DATA:  Fall today with right elbow pain. EXAM: RIGHT ELBOW - COMPLETE 3+ VIEW COMPARISON:  None. FINDINGS: There is no evidence of fracture, dislocation, or joint effusion. There is no evidence of arthropathy or other focal bone abnormality. Soft tissues are unremarkable. IMPRESSION: Negative. Electronically Signed   By: Elberta Fortis M.D.   On: 06/10/2018 18:31   Dg Hand Complete Left  Result Date: 06/10/2018 CLINICAL DATA:  Fall today with left hand pain. EXAM: LEFT HAND - COMPLETE 3+ VIEW COMPARISON:  None. FINDINGS: Minimal displaced fracture involving the base of the first metacarpal. Remaining bones, joint spaces and soft tissues are within normal. IMPRESSION: Minimally displaced fracture base of first metacarpal. Electronically Signed   By: Elberta Fortis M.D.   On: 06/10/2018 18:29   Dg Finger Thumb Left  Result Date: 06/10/2018 CLINICAL DATA:  Fall with left thumb pain. EXAM: LEFT THUMB 2+V COMPARISON:  None. FINDINGS: Exam demonstrates a minimally displaced fracture involving the base of the first metacarpal. IMPRESSION: Minimally displaced fracture base of first metacarpal. Electronically Signed   By: Elberta Fortis M.D.   On: 06/10/2018 18:28    Procedures Procedures (including critical care time)  SPLINT APPLICATION Date/Time: 7:15 PM Authorized by: Harvie Heck Consent: Verbal consent obtained. Risks and benefits: risks, benefits and alternatives were discussed Consent given by: patient Splint applied by: EDT Location details: LUE Splint type: thumb spica Post-procedure: The splinted body part was neurovascularly unchanged following the procedure. Patient tolerance: Patient tolerated the procedure well with no immediate complications.  Medications Ordered in ED Medications  naproxen (NAPROSYN) tablet 500 mg (has no administration in time range)   Initial Impression /  Assessment and Plan / ED Course  I have reviewed the triage vital signs and the nursing notes.  Pertinent labs & imaging results that were available during my care of the patient were reviewed by me and considered in my medical decision making (see chart for details).   Patient presents to the emergency department status post mechanical fall with complaints of left thumb pain as well as some mild right shoulder discomfort.  Patient nontoxic-appearing, no apparent distress, vitals WNL. No evidence of serious head/neck/back injury. No focal neuro deficits or midline spinal tenderness. [facility] x-rays ordered per areas of tenderness notable for minimally displaced fracture of the base of first metacarpal. He also has some snuffbox tenderness on exam as well, no scaphoid fx on imaging. NVI distally. Will place in thumb spica splint with recommendations for PRICE as well as naproxen/percocet prescriptions [facility] Controlled Substance reporting System queried) and hand surgery follow up. I discussed results, treatment plan, need for follow-up, and return precautions with the patient and his mother at bedside. Provided opportunity for questions, patient and his mother confirmed understanding and are in agreement with plan.   Final Clinical Impressions(s) / ED Diagnoses   Final diagnoses:  Closed displaced fracture of base of first metacarpal bone of left hand, unspecified fracture morphology, initial encounter  Fall, initial encounter    ED Discharge Orders         Ordered    naproxen (NAPROSYN) 500 MG tablet  2 times daily     06/10/18 1917    oxyCODONE-acetaminophen (PERCOCET/ROXICET) 5-325 MG tablet  Every 6 hours PRN     06/10/18 1917           Cherly Anderson, PA-C 06/10/18 1941    Benjiman Core, MD 06/11/18 [phone]

## 2019-07-06 ENCOUNTER — Telehealth: Payer: Self-pay | Admitting: Nurse Practitioner

## 2019-07-06 DIAGNOSIS — Z20822 Contact with and (suspected) exposure to covid-19: Secondary | ICD-10-CM

## 2019-07-06 DIAGNOSIS — J029 Acute pharyngitis, unspecified: Secondary | ICD-10-CM

## 2019-07-06 DIAGNOSIS — R519 Headache, unspecified: Secondary | ICD-10-CM

## 2019-07-06 DIAGNOSIS — Z20828 Contact with and (suspected) exposure to other viral communicable diseases: Secondary | ICD-10-CM

## 2019-07-06 DIAGNOSIS — R52 Pain, unspecified: Secondary | ICD-10-CM

## 2019-07-06 MED ORDER — BENZONATATE 100 MG PO CAPS: 100.0000 mg | ORAL_CAPSULE | Freq: Three times a day (TID) | ORAL | 0 refills | Status: DC | PRN

## 2019-07-06 NOTE — Progress Notes (Signed)
E-Visit for Corona Virus Screening   Your current symptoms could be consistent with the coronavirus.  Many health care providers can now test patients at their office but not all are.  [facility] has multiple testing sites. For information on our COVID testing locations and hours go to [email or web address]  Please quarantine yourself while awaiting your test results.  We are enrolling you in our MyChart Home [facility] for COVID19 . Daily you will receive a questionnaire within the MyChart website. Our COVID 19 response team willl be monitoriing your responses daily. Please continue good preventive care measures, including:  frequent hand-washing, avoid touching your face, cover coughs/sneezes, stay out of crowds and keep a 6 foot distance from others.    You can go to one of the  testing sites listed below, while they are opened (see hours). You do not need a doctors order to be tested for covid.You do need to self-isolate until your results return and if positive 14 days from when your symptoms started and until you are 3 days symptom free.   Testing Locations (Monday - Friday, 10a.m. - 3:30 p.m.)   [place]: [facility] (Visitor Entrance), [address], [place], [place] -   [place]: [facility], [address], [place], [place] (entrance [address]) -   [place] (Closed each Monday): [address], [place], [place] - the short stay covered drive at [facility] (Use the [address] entrance to [facility] next to [facility].) -    COVID-19 is a respiratory illness with symptoms that are similar to the flu. Symptoms are typically mild to moderate, but there have been cases of severe illness and death due to the virus. The following symptoms may appear 2-14 days after exposure: . Fever . Cough . Shortness of breath or difficulty  breathing . Chills . Repeated shaking with chills . Muscle pain . Headache . Sore throat . New loss of taste or smell . Fatigue . Congestion or runny nose . Nausea or vomiting . Diarrhea  If you develop fever/cough/breathlessness, please stay home for 10 days with improving symptoms and until you have had 24 hours of no fever (without taking a fever reducer).  Go to the nearest hospital ED for assessment if fever/cough/breathlessness are severe or illness seems like a threat to life.  It is vitally important that if you feel that you have an infection such as this virus or any other virus that you stay home and away from places where you may spread it to others.  You should avoid contact with people age 65 and older.   You should wear a mask or cloth face covering over your nose and mouth if you must be around other people or animals, including pets (even at home). Try to stay at least 6 feet away from other people. This will protect the people around you.  You can use medication such as A prescription cough medication called Tessalon Perles 100 mg. You may take 1-2 capsules every 8 hours as needed for cough  You may also take acetaminophen (Tylenol) as needed for fever.   Reduce your risk of any infection by using the same precautions used for avoiding the common cold or flu:  . Wash your hands often with soap and warm water for at least 20 seconds.  If soap and water are not readily available, use an alcohol-based hand sanitizer with at least 60% alcohol.  .   If coughing or sneezing, cover your mouth and nose by coughing or sneezing into the elbow areas of your shirt or coat, into a tissue or into your sleeve (not your hands). . Avoid shaking hands with others and consider head nods or verbal greetings only. . Avoid touching your eyes, nose, or mouth with unwashed hands.  . Avoid close contact with people who are sick. . Avoid places or events with large numbers of people in one location,  like concerts or sporting events. . Carefully consider travel plans you have or are making. . If you are planning any travel outside or inside the [place], visit the CDC's Travelers' Health webpage for the latest health notices. . If you have some symptoms but not all symptoms, continue to monitor at home and seek medical attention if your symptoms worsen. . If you are having a medical emergency, call 911.  HOME CARE . Only take medications as instructed by your medical team. . Drink plenty of fluids and get plenty of rest. . A steam or ultrasonic humidifier can help if you have congestion.   GET HELP RIGHT AWAY IF YOU HAVE EMERGENCY WARNING SIGNS** FOR COVID-19. If you or someone is showing any of these signs seek emergency medical care immediately. Call 911 or proceed to your closest emergency facility if: . You develop worsening high fever. . Trouble breathing . Bluish lips or face . Persistent pain or pressure in the chest . New confusion . Inability to wake or stay awake . You cough up blood. . Your symptoms become more severe  **This list is not all possible symptoms. Contact your medical provider for any symptoms that are sever or concerning to you.   MAKE SURE YOU   Understand these instructions.  Will watch your condition.  Will get help right away if you are not doing well or get worse.  Your e-visit answers were reviewed by a board certified advanced clinical practitioner to complete your personal care plan.  Depending on the condition, your plan could have included both over the counter or prescription medications.  If there is a problem please reply once you have received a response from your provider.  Your safety is important to us.  If you have drug allergies check your prescription carefully.    You can use MyChart to ask questions about today's visit, request a non-urgent call back, or ask for a work or school excuse for 24 hours related to this e-Visit. If it has  been greater than 24 hours you will need to follow up with your provider, or enter a new e-Visit to address those concerns. You will get an e-mail in the next two days asking about your experience.  I hope that your e-visit has been valuable and will speed your recovery. Thank you for using e-visits.   5-10 minutes spent reviewing and documenting in chart.  

## 2019-07-07 ENCOUNTER — Other Ambulatory Visit: Payer: Self-pay

## 2019-07-07 DIAGNOSIS — Z20822 Contact with and (suspected) exposure to covid-19: Secondary | ICD-10-CM

## 2019-07-09 LAB — NOVEL CORONAVIRUS, NAA: SARS-CoV-2, NAA: NOT DETECTED

## 2021-09-20 NOTE — Progress Notes (Signed)
New Patient Office Visit  Subjective:  Patient ID: Phillip Cabrera, male    DOB: 02/04/1991  Age: 31 y.o. MRN: [id number]  CC:  Chief Complaint  Patient presents with   Establish Care    Np. Est care. Pt c/o knot under right shoulder blade w/ discomfort, and tennis elbow.     HPI Phillip Cabrera presents for new patient visit to establish care.  Introduced to Publishing rights managernurse practitioner role and practice setting.  All questions answered.  Discussed provider/patient relationship and expectations.  He has a knot under his right shoulder blade that he noticed for a couple years. Now has started causing intermittent pain. He was told in the past that he could have it removed, but it wasn't bothering him so he didn't. He states that is may have gotten bigger in size. He denies redness, fevers, and drainage.  He also endorses pain in his right elbow for the past 3 months. 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 He has tried heat, massaging the area and rest. The pain is usually worse at night. The pain worsens after using his arm at work. He thinks he has tendonitis in his elbow.   Past Medical History:  Diagnosis Date   History of hand fracture     Past Surgical History:  Procedure Laterality Date   HAND SURGERY      Family History  Problem Relation Age of Onset   Cancer Maternal Aunt        spinal   Cancer Paternal Grandmother        breast    Social History   Socioeconomic History   Marital status: Single    Spouse name: Not on file   Number of children: Not on file   Years of education: Not on file   Highest education level: Not on file  Occupational History   Not on file  Tobacco Use   Smoking status: Never   Smokeless tobacco: Former    Types: Snuff  Vaping Use   Vaping Use: Never used  Substance and Sexual Activity   Alcohol use: Not Currently    Comment: Stopped in 2020   Drug use: No   Sexual activity: Not on file  Other Topics Concern   Not on file   Social History Narrative   Not on file   Social Determinants of Health   Financial Resource Strain: Not on file  Food Insecurity: Not on file  Transportation Needs: Not on file  Physical Activity: Not on file  Stress: Not on file  Social Connections: Not on file  Intimate Partner Violence: Not on file    ROS Review of Systems  Constitutional:  Positive for fatigue.  HENT: Negative.    Respiratory: Negative.    Cardiovascular: Negative.   Gastrointestinal: Negative.   Genitourinary: Negative.   Musculoskeletal:  Positive for arthralgias (right elbow and shoulder pain).  Skin: Negative.   Neurological: Negative.   Psychiatric/Behavioral: Negative.     Objective:   Today's Vitals: BP 117/60    Pulse 85    Temp (!) 96.9 F (36.1 C) (Temporal)    Ht 5\' 7"  (1.702 m)    Wt 195 lb 9.6 oz (88.7 kg)    SpO2 98%    BMI 30.64 kg/m   Physical Exam Vitals and nursing note reviewed.  Constitutional:      Appearance: Normal appearance.  HENT:     Head: Normocephalic.  Eyes:  Conjunctiva/sclera: Conjunctivae normal.  Cardiovascular:     Rate and Rhythm: Normal rate and regular rhythm.     Pulses: Normal pulses.     Heart sounds: Normal heart sounds.  Pulmonary:     Effort: Pulmonary effort is normal.     Breath sounds: Normal breath sounds.  Musculoskeletal:        General: Tenderness (point tenderness to right elbow) present. No swelling. Normal range of motion.     Cervical back: Normal range of motion.  Skin:    General: Skin is warm.     Comments: Raised area under skin below right shoulder blade. No redness, warmth, signs of infection  Neurological:     General: No focal deficit present.     Mental Status: He is alert and oriented to person, place, and time.  Psychiatric:        Mood and Affect: Mood normal.        Behavior: Behavior normal.        Thought Content: Thought content normal.        Judgment: Judgment normal.    Assessment & Plan:   Problem List  Items Addressed This Visit       Other   Lump of skin of back - Primary    Discussed options of ultrasound or referral to general surgery for removal. He would like to have an ultrasound first since it is not completely bothersome and he does not want to take off of work if he doesn't have to. Will follow up based on results of ultrasound.       Relevant Orders   [place] CHEST SOFT TISSUE   Chronic right shoulder pain    Chronic right shoulder and right elbow pain, most likely from tendonitis/overuse from his work. His work involves frequent digging and shoveling. Discussed ice and heat prn. Start meloxicam once a day with food. Stretches printed and given to do daily. If symptoms are ongoing, discussed imaging and steroid injections. Follow up in 4 weeks.       Relevant Medications   ibuprofen (ADVIL) 800 MG tablet   meloxicam (MOBIC) 15 MG tablet   Right elbow pain    Chronic right shoulder and right elbow pain, most likely from tendonitis/overuse from his work. His work involves frequent digging and shoveling. Discussed ice and heat prn. Start meloxicam once a day with food. Stretches printed and given to do daily. If symptoms are ongoing, discussed imaging and steroid injections. Follow up in 4 weeks.       Other Visit Diagnoses     Need for Tdap vaccination       Tdap given today   Relevant Orders   Tdap vaccine greater than or equal to 7yo IM (Completed)       Outpatient Encounter Medications as of 09/21/2021  Medication Sig   meloxicam (MOBIC) 15 MG tablet Take 1 tablet (15 mg total) by mouth daily.   cetirizine (ZYRTEC) 10 MG tablet Take 10 mg by mouth daily.   ibuprofen (ADVIL) 800 MG tablet ibuprofen 800 mg tablet  Take 1 tablet 3 times a day by oral route.   [DISCONTINUED] benzonatate (TESSALON PERLES) 100 MG capsule Take 1 capsule (100 mg total) by mouth 3 (three) times daily as needed.   [DISCONTINUED] calamine lotion Apply 1 application topically as needed. Patient used  this medication for itching.   [DISCONTINUED] diphenhydrAMINE (BENADRYL) 25 MG tablet Take 2 tablets (50 mg total) by mouth every 6 (six) hours. Take  1-2 tablets every 6 hours x 2 days, then space out to an as needed basis   [DISCONTINUED] naproxen (NAPROSYN) 500 MG tablet Take 1 tablet (500 mg total) by mouth 2 (two) times daily.   [DISCONTINUED] ondansetron (ZOFRAN) 4 MG tablet Take 1 tablet (4 mg total) by mouth every 8 (eight) hours as needed for nausea or vomiting.   [DISCONTINUED] oxyCODONE-acetaminophen (PERCOCET/ROXICET) 5-325 MG tablet Take 1 tablet by mouth every 6 (six) hours as needed for severe pain.   No facility-administered encounter medications on file as of 09/21/2021.    Follow-up: Return in about 4 weeks (around 10/19/2021) for CPE.   Gerre Scull, NP

## 2021-09-21 ENCOUNTER — Encounter: Payer: Self-pay | Admitting: Nurse Practitioner

## 2021-09-21 ENCOUNTER — Other Ambulatory Visit: Payer: Self-pay

## 2021-09-21 ENCOUNTER — Ambulatory Visit: Payer: 59 | Admitting: Nurse Practitioner

## 2021-09-21 VITALS — BP 117/60 | HR 85 | Temp 96.9°F | Ht 67.0 in | Wt 195.6 lb

## 2021-09-21 DIAGNOSIS — M25521 Pain in right elbow: Secondary | ICD-10-CM

## 2021-09-21 DIAGNOSIS — Z23 Encounter for immunization: Secondary | ICD-10-CM | POA: Diagnosis not present

## 2021-09-21 DIAGNOSIS — G8929 Other chronic pain: Secondary | ICD-10-CM | POA: Diagnosis not present

## 2021-09-21 DIAGNOSIS — R222 Localized swelling, mass and lump, trunk: Secondary | ICD-10-CM

## 2021-09-21 DIAGNOSIS — M25511 Pain in right shoulder: Secondary | ICD-10-CM

## 2021-09-21 MED ORDER — MELOXICAM 15 MG PO TABS: 15.0000 mg | ORAL_TABLET | Freq: Every day | ORAL | 1 refills | Status: DC

## 2021-09-21 NOTE — Patient Instructions (Signed)
It was great to see you!  Start meloxicam 1 tablet daily as needed with food for pain. Do the attached stretches daily. Follow up as discussed for elbow injection if symptoms not improving.  I have ordered an ultrasound for the lump on your back.   Let's follow-up in 4 weeks, sooner if you have concerns.  If a referral was placed today, you will be contacted for an appointment. Please note that routine referrals can sometimes take up to 3-4 weeks to process. Please call our office if you haven't heard anything after this time frame.  Take care,  Rodman Pickle, NP

## 2021-09-23 DIAGNOSIS — M25521 Pain in right elbow: Secondary | ICD-10-CM | POA: Insufficient documentation

## 2021-09-23 DIAGNOSIS — G8929 Other chronic pain: Secondary | ICD-10-CM | POA: Insufficient documentation

## 2021-09-23 DIAGNOSIS — R222 Localized swelling, mass and lump, trunk: Secondary | ICD-10-CM | POA: Insufficient documentation

## 2021-09-23 NOTE — Assessment & Plan Note (Signed)
Chronic right shoulder and right elbow pain, most likely from tendonitis/overuse from his work. His work involves frequent digging and shoveling. Discussed ice and heat prn. Start meloxicam once a day with food. Stretches printed and given to do daily. If symptoms are ongoing, discussed imaging and steroid injections. Follow up in 4 weeks.  °

## 2021-09-23 NOTE — Assessment & Plan Note (Signed)
Discussed options of ultrasound or referral to general surgery for removal. He would like to have an ultrasound first since it is not completely bothersome and he does not want to take off of work if he doesn't have to. Will follow up based on results of ultrasound.

## 2021-09-23 NOTE — Assessment & Plan Note (Signed)
Chronic right shoulder and right elbow pain, most likely from tendonitis/overuse from his work. His work involves frequent digging and shoveling. Discussed ice and heat prn. Start meloxicam once a day with food. Stretches printed and given to do daily. If symptoms are ongoing, discussed imaging and steroid injections. Follow up in 4 weeks.

## 2021-10-05 ENCOUNTER — Ambulatory Visit
Admission: RE | Admit: 2021-10-05 | Discharge: 2021-10-05 | Disposition: A | Discharge status: Home / Self Care | Payer: 59 | Source: Ambulatory Visit | Attending: Nurse Practitioner | Admitting: Nurse Practitioner

## 2021-10-05 DIAGNOSIS — R222 Localized swelling, mass and lump, trunk: Secondary | ICD-10-CM

## 2021-10-10 ENCOUNTER — Telehealth: Payer: Self-pay | Admitting: Nurse Practitioner

## 2021-10-10 DIAGNOSIS — R222 Localized swelling, mass and lump, trunk: Secondary | ICD-10-CM

## 2021-10-10 NOTE — Telephone Encounter (Signed)
-----   Message from Hall Busing, [place] sent at 10/10/2021 10:20 AM EST ----- Called and spoke to patient, patient agreed to referral to general surgeon

## 2021-10-18 ENCOUNTER — Other Ambulatory Visit: Payer: Self-pay

## 2021-10-19 ENCOUNTER — Ambulatory Visit (INDEPENDENT_AMBULATORY_CARE_PROVIDER_SITE_OTHER): Payer: 59 | Admitting: Nurse Practitioner

## 2021-10-19 ENCOUNTER — Encounter: Payer: Self-pay | Admitting: Nurse Practitioner

## 2021-10-19 VITALS — BP 108/62 | HR 61 | Temp 97.7°F | Ht 67.0 in | Wt 193.4 lb

## 2021-10-19 DIAGNOSIS — Z0000 Encounter for general adult medical examination without abnormal findings: Secondary | ICD-10-CM | POA: Diagnosis not present

## 2021-10-19 DIAGNOSIS — Z136 Encounter for screening for cardiovascular disorders: Secondary | ICD-10-CM

## 2021-10-19 DIAGNOSIS — Z114 Encounter for screening for human immunodeficiency virus [HIV]: Secondary | ICD-10-CM | POA: Diagnosis not present

## 2021-10-19 DIAGNOSIS — Z1159 Encounter for screening for other viral diseases: Secondary | ICD-10-CM | POA: Diagnosis not present

## 2021-10-19 DIAGNOSIS — M25521 Pain in right elbow: Secondary | ICD-10-CM

## 2021-10-19 DIAGNOSIS — R222 Localized swelling, mass and lump, trunk: Secondary | ICD-10-CM

## 2021-10-19 DIAGNOSIS — Z13220 Encounter for screening for lipoid disorders: Secondary | ICD-10-CM | POA: Diagnosis not present

## 2021-10-19 DIAGNOSIS — R21 Rash and other nonspecific skin eruption: Secondary | ICD-10-CM

## 2021-10-19 MED ORDER — TRIAMCINOLONE ACETONIDE 0.1 % EX CREA: 1.0000 "application " | TOPICAL_CREAM | Freq: Two times a day (BID) | CUTANEOUS | 0 refills | Status: DC

## 2021-10-19 NOTE — Progress Notes (Signed)
BP 108/62    Pulse 61    Temp 97.7 F (36.5 C) (Temporal)    Ht 5\' 7"  (1.702 m)    Wt 193 lb 6.4 oz (87.7 kg)    SpO2 96%    BMI 30.29 kg/m    Subjective:    Patient ID: Phillip Cabrera, male    DOB: 08/22/1990, 31 y.o.   MRN: [id number]  CC: Chief Complaint  Patient presents with   Annual Exam    CPE.    HPI: Phillip Cabrera is a 31 y.o. male presenting on 10/19/2021 for comprehensive medical examination. Current medical complaints include: ongoing right elbow pain  He was started on meloxicam for his elbow and shoulder pain, most likely from tendonitis from overuse with his job. His shoulder pain has gone away, and his elbow pain has moved slightly lower to his forearm. He has been taking meloxicam as needed, which doesn't always help his pain. It tends to get worse with lifting and activity and is better with rest.   He currently lives with: mother, brother Interim Problems from his last visit: no  Depression and Anxiety Screen done today and results listed below:  Depression screen [facility] 2/9 10/19/2021 09/21/2021  Decreased Interest 0 0  Down, Depressed, Hopeless 0 0  PHQ - 2 Score 0 0  Altered sleeping 1 -  Tired, decreased energy 1 -  Change in appetite 0 -  Feeling bad or failure about yourself  0 -  Trouble concentrating 0 -  Moving slowly or fidgety/restless 0 -  Suicidal thoughts 0 -  PHQ-9 Score 2 -  Difficult doing work/chores Not difficult at all -   GAD 7 : Generalized Anxiety Score 10/19/2021 09/21/2021  Nervous, Anxious, on Edge 1 1  Control/stop worrying 0 0  Worry too much - different things 0 0  Trouble relaxing 0 0  Restless 0 0  Easily annoyed or irritable 0 0  Afraid - awful might happen 0 0  Total GAD 7 Score 1 1  Anxiety Difficulty Not difficult at all Not difficult at all    The patient does not have a history of falls. I did not complete a risk assessment for falls. A plan of care for falls was not documented.   Past Medical History:  Past Medical  History:  Diagnosis Date   History of hand fracture     Surgical History:  Past Surgical History:  Procedure Laterality Date   HAND SURGERY      Medications:  Current Outpatient Medications on File Prior to Visit  Medication Sig   cetirizine (ZYRTEC) 10 MG tablet Take 10 mg by mouth daily.   ibuprofen (ADVIL) 800 MG tablet ibuprofen 800 mg tablet  Take 1 tablet 3 times a day by oral route.   meloxicam (MOBIC) 15 MG tablet Take 1 tablet (15 mg total) by mouth daily.   No current facility-administered medications on file prior to visit.    Allergies:  No Known Allergies  Social History:  Social History   Socioeconomic History   Marital status: Single    Spouse name: Not on file   Number of children: Not on file   Years of education: Not on file   Highest education level: Not on file  Occupational History   Not on file  Tobacco Use   Smoking status: Never   Smokeless tobacco: Former    Types: Snuff  Vaping Use   Vaping Use: Never used  Substance and  Sexual Activity   Alcohol use: Not Currently    Comment: Stopped in 2020   Drug use: No   Sexual activity: Not on file  Other Topics Concern   Not on file  Social History Narrative   Not on file   Social Determinants of Health   Financial Resource Strain: Not on file  Food Insecurity: Not on file  Transportation Needs: Not on file  Physical Activity: Not on file  Stress: Not on file  Social Connections: Not on file  Intimate Partner Violence: Not on file   Social History   Tobacco Use  Smoking Status Never  Smokeless Tobacco Former   Types: Snuff   Social History   Substance and Sexual Activity  Alcohol Use Not Currently   Comment: Stopped in 2020    Family History:  Family History  Problem Relation Age of Onset   Cancer Maternal Aunt        spinal   Cancer Paternal Grandmother        breast    Past medical history, surgical history, medications, allergies, family history and social history  reviewed with patient today and changes made to appropriate areas of the chart.   Review of Systems  Constitutional:  Positive for malaise/fatigue. Negative for fever.  HENT: Negative.    Eyes: Negative.   Respiratory: Negative.    Cardiovascular: Negative.   Gastrointestinal:  Positive for heartburn. Negative for abdominal pain.  Genitourinary: Negative.   Musculoskeletal:  Positive for joint pain (right elbow).  Skin: Negative.   Neurological: Negative.   Psychiatric/Behavioral: Negative.    All other ROS negative except what is listed above and in the HPI.      Objective:    BP 108/62    Pulse 61    Temp 97.7 F (36.5 C) (Temporal)    Ht 5\' 7"  (1.702 m)    Wt 193 lb 6.4 oz (87.7 kg)    SpO2 96%    BMI 30.29 kg/m   Wt Readings from Last 3 Encounters:  10/19/21 193 lb 6.4 oz (87.7 kg)  09/21/21 195 lb 9.6 oz (88.7 kg)  06/10/18 168 lb (76.2 kg)    Physical Exam Vitals and nursing note reviewed.  Constitutional:      Appearance: Normal appearance.  HENT:     Head: Normocephalic and atraumatic.     Right Ear: Tympanic membrane, ear canal and external ear normal.     Left Ear: Tympanic membrane, ear canal and external ear normal.     Nose: Nose normal.     Mouth/Throat:     Mouth: Mucous membranes are moist.     Pharynx: Oropharynx is clear.  Eyes:     Conjunctiva/sclera: Conjunctivae normal.  Cardiovascular:     Rate and Rhythm: Normal rate and regular rhythm.     Pulses: Normal pulses.     Heart sounds: Normal heart sounds.  Pulmonary:     Effort: Pulmonary effort is normal.     Breath sounds: Normal breath sounds.  Abdominal:     General: Bowel sounds are normal.     Palpations: Abdomen is soft.     Tenderness: There is no abdominal tenderness.  Musculoskeletal:        General: Normal range of motion.     Cervical back: Normal range of motion and neck supple. No tenderness.  Lymphadenopathy:     Cervical: No cervical adenopathy.  Skin:    General: Skin is  warm and dry.  Findings: Rash (raised wheals noted to upper chest) present.  Neurological:     General: No focal deficit present.     Mental Status: He is alert and oriented to person, place, and time.     Cranial Nerves: No cranial nerve deficit.     Gait: Gait normal.     Deep Tendon Reflexes: Reflexes normal.  Psychiatric:        Mood and Affect: Mood normal.        Behavior: Behavior normal.        Thought Content: Thought content normal.        Judgment: Judgment normal.    Results for orders placed or performed in visit on 07/07/19  Novel Coronavirus, NAA ([facility])   Specimen: Nasopharyngeal(NP) swabs in vial transport medium   NASOPHARYNGE  TESTING  Result Value Ref Range   SARS-CoV-2, NAA Not Detected Not Detected      Assessment & Plan:   Problem List Items Addressed This Visit       Other   Lump of skin of back    Ultrasound done and findings consistent with lipoma. With area being large on his back, discussed referral to surgeon for removal. He declines at this time and will reach out if he would like this referral placed.       Right elbow pain    Ongoing, however pain has moved down slightly to forearm. He can continue meloxciam as needed for pain. He can use ice/heat as needed as well. He can also try an arm strap to help with support. If these measures don't help his pain, discussed referral to orthopedics. He will reach out if he would like this referral placed.       Other Visit Diagnoses     Routine general medical examination at a health care facility    -  Primary   Health maintenance reviewed and updated. Check CMP, CBC today. Follow up in 1 year.    Relevant Orders   CBC   Comprehensive metabolic panel   Encounter for hepatitis C screening test for low risk patient       Screen hepatitis C today   Relevant Orders   Hepatitis C antibody   Screening for HIV (human immunodeficiency virus)       Screen HIV today   Relevant Orders   HIV Antibody  (routine testing w rflx)   Encounter for lipid screening for cardiovascular disease       Screen lipid today   Relevant Orders   Lipid panel   Rash       Noted to chest, he thinks he is allergic to his mom's cat. Will treat with triamcinolone cream BID prn. F/U if symptoms worsen or don't improve        Discussed aspirin prophylaxis for myocardial infarction prevention and decision was it was not indicated  LABORATORY TESTING:  Health maintenance labs ordered today as discussed above.    IMMUNIZATIONS:   - Tdap: Tetanus vaccination status reviewed: last tetanus booster within 10 years. - Influenza: Refused - Pneumovax: Not applicable - Prevnar: Not applicable - HPV: Up to date - Zostavax vaccine: Not applicable  SCREENING: - Colonoscopy: Not applicable  Discussed with patient purpose of the colonoscopy is to detect colon cancer at curable precancerous or early stages   - AAA Screening: Not applicable  -Hearing Test: Not applicable  -Spirometry: Not applicable   PATIENT COUNSELING:    Sexuality: Discussed sexually transmitted diseases, partner selection, use of  condoms, avoidance of unintended pregnancy  and contraceptive alternatives.   Advised to avoid cigarette smoking.  I discussed with the patient that most people either abstain from alcohol or drink within safe limits (<=14/week and <=4 drinks/occasion for males, <=7/weeks and <= 3 drinks/occasion for females) and that the risk for alcohol disorders and other health effects rises proportionally with the number of drinks per week and how often a drinker exceeds daily limits.  Discussed cessation/primary prevention of drug use and availability of treatment for abuse.   Diet: Encouraged to adjust caloric intake to maintain  or achieve ideal body weight, to reduce intake of dietary saturated fat and total fat, to limit sodium intake by avoiding high sodium foods and not adding table salt, and to maintain adequate dietary  potassium and calcium preferably from fresh fruits, vegetables, and low-fat dairy products.    stressed the importance of regular exercise  Injury prevention: Discussed safety belts, safety helmets, smoke detector, smoking near bedding or upholstery.   Dental health: Discussed importance of regular tooth brushing, flossing, and dental visits.   Follow up plan: NEXT PREVENTATIVE PHYSICAL DUE IN 1 YEAR. Return in about 1 year (around 10/20/2022) for CPE.

## 2021-10-19 NOTE — Assessment & Plan Note (Signed)
Ongoing, however pain has moved down slightly to forearm. He can continue meloxciam as needed for pain. He can use ice/heat as needed as well. He can also try an arm strap to help with support. If these measures don't help his pain, discussed referral to orthopedics. He will reach out if he would like this referral placed.  ?

## 2021-10-19 NOTE — Assessment & Plan Note (Signed)
Ultrasound done and findings consistent with lipoma. With area being large on his back, discussed referral to surgeon for removal. He declines at this time and will reach out if he would like this referral placed.  ?

## 2021-10-19 NOTE — Patient Instructions (Addendum)
It was great to see you! ? ?We are checking your labs and will send the results via mychart.  ? ?You can use the triamcinolone cream twice a day as needed for the rash on your chest.  ? ?Let me know if you would like a referral to the surgeon or orthopedics.  ? ?Let's follow-up in 1 year, sooner if you have concerns. ? ?If a referral was placed today, you will be contacted for an appointment. Please note that routine referrals can sometimes take up to 3-4 weeks to process. Please call our office if you haven't heard anything after this time frame. ? ?Take care, ? ?Rodman Pickle, NP ? ?

## 2021-10-22 LAB — HIV ANTIBODY (ROUTINE TESTING W REFLEX): HIV 1&2 Ab, 4th Generation: NONREACTIVE

## 2021-10-22 LAB — LIPID PANEL
Cholesterol: 173 mg/dL (ref <200)
HDL: 40 mg/dL (ref >=40)
LDL Cholesterol (Calc): 104 mg/dL (calc) — ABNORMAL HIGH
Non-HDL Cholesterol (Calc): 133 mg/dL (calc) — ABNORMAL HIGH (ref <130)
Total CHOL/HDL Ratio: 4.3 (calc) (ref <5.0)
Triglycerides: 176 mg/dL — ABNORMAL HIGH (ref <150)

## 2021-10-22 LAB — COMPREHENSIVE METABOLIC PANEL
AG Ratio: 2 (calc) (ref 1.0–2.5)
ALT: 19 U/L (ref 9–46)
AST: 19 U/L (ref 10–40)
Albumin: 4.7 g/dL (ref 3.6–5.1)
Alkaline phosphatase (APISO): 82 U/L (ref 36–130)
BUN: 22 mg/dL (ref 7–25)
CO2: 24 mmol/L (ref 20–32)
Calcium: 9.5 mg/dL (ref 8.6–10.3)
Chloride: 105 mmol/L (ref 98–110)
Creat: 1.08 mg/dL (ref 0.60–1.24)
Globulin: 2.3 g/dL (calc) (ref 1.9–3.7)
Glucose, Bld: 86 mg/dL (ref 65–99)
Potassium: 4.1 mmol/L (ref 3.5–5.3)
Sodium: 141 mmol/L (ref 135–146)
Total Bilirubin: 0.5 mg/dL (ref 0.2–1.2)
Total Protein: 7 g/dL (ref 6.1–8.1)

## 2021-10-22 LAB — CBC
HCT: 43 % (ref 38.5–50.0)
Hemoglobin: 14.8 g/dL (ref 13.2–17.1)
MCH: 31.1 pg (ref 27.0–33.0)
MCHC: 34.4 g/dL (ref 32.0–36.0)
MCV: 90.3 fL (ref 80.0–100.0)
MPV: 10.7 fL (ref 7.5–12.5)
Platelets: 255 10*3/uL (ref 140–400)
RBC: 4.76 10*6/uL (ref 4.20–5.80)
RDW: 11.7 % (ref 11.0–15.0)
WBC: 7.8 10*3/uL (ref 3.8–10.8)

## 2021-10-22 LAB — HEPATITIS C ANTIBODY
Hepatitis C Ab: NONREACTIVE
SIGNAL TO CUT-OFF: 0.03 (ref <1.00)

## 2022-01-10 ENCOUNTER — Other Ambulatory Visit: Payer: Self-pay

## 2022-01-10 MED ORDER — MELOXICAM 15 MG PO TABS: 15.0000 mg | ORAL_TABLET | Freq: Every day | ORAL | 1 refills | Status: DC

## 2022-01-10 NOTE — Telephone Encounter (Signed)
Chart supports rx refill Last ov: [id number] Last refill: 09/21/21

## 2022-09-18 IMAGING — US US SOFT TISSUE
1 series · 7 of 7 positions shown · non-contrast
Comparison: None.

CLINICAL DATA: Palpable abnormality in the left chest adjacent to
the scapula

EXAM:
ULTRASOUND OF CHEST SOFT TISSUES
TECHNIQUE: Ultrasound examination of the chest wall soft tissues was performed
in the area of clinical concern.

[Series 1: us soft tissue · 0.10mm/px · 7 acquisitions, 7 frames shown]
[im 1/7]
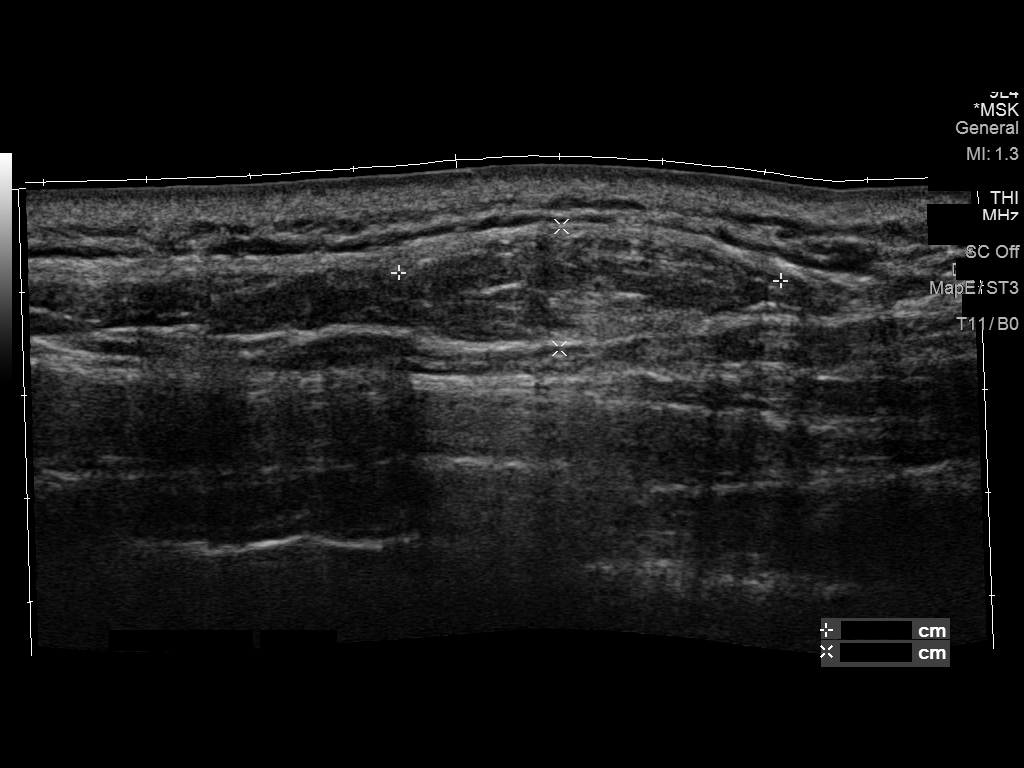
[im 2/7]
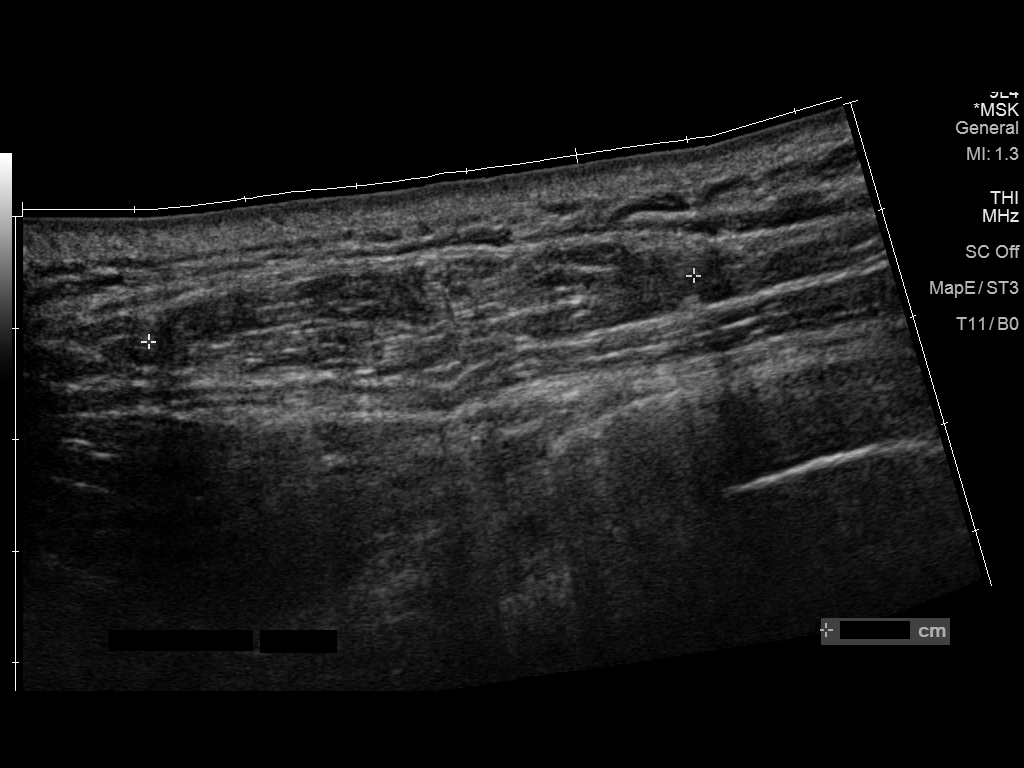
[im 3/7]
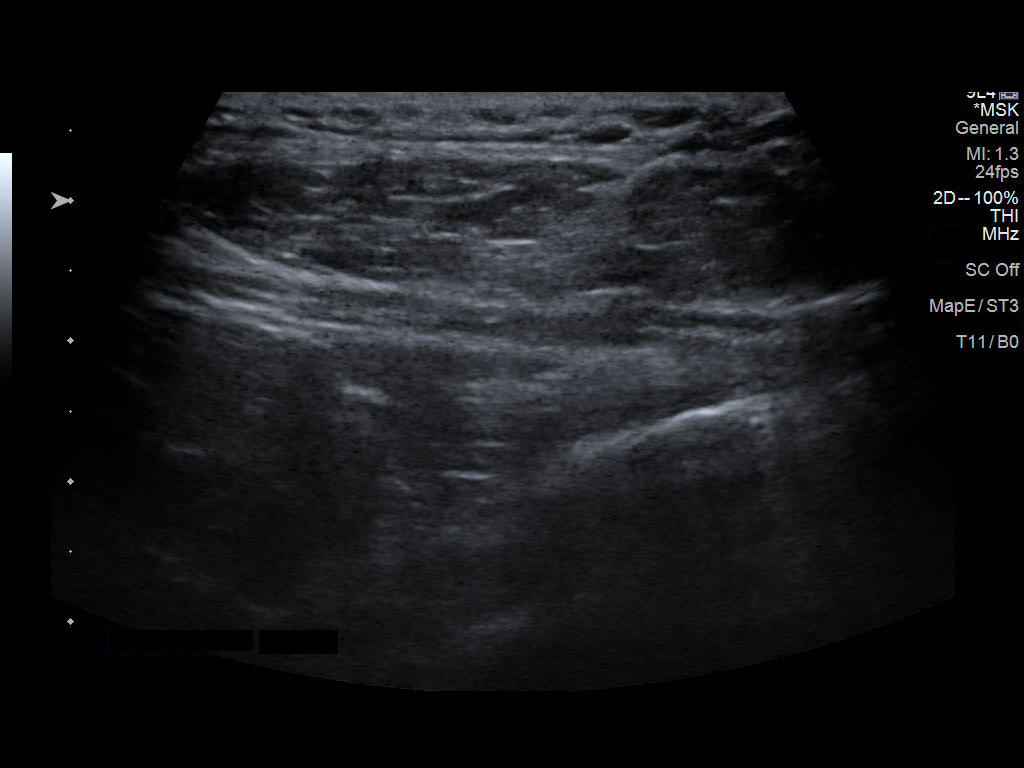
[im 4/7]
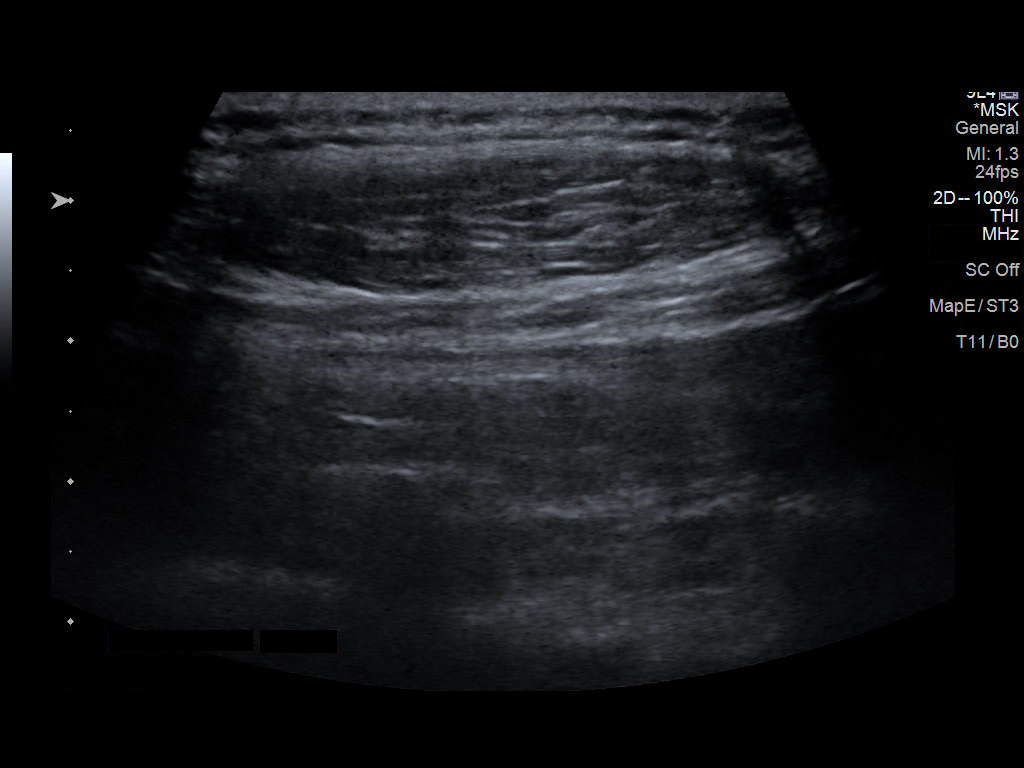
[im 5/7]
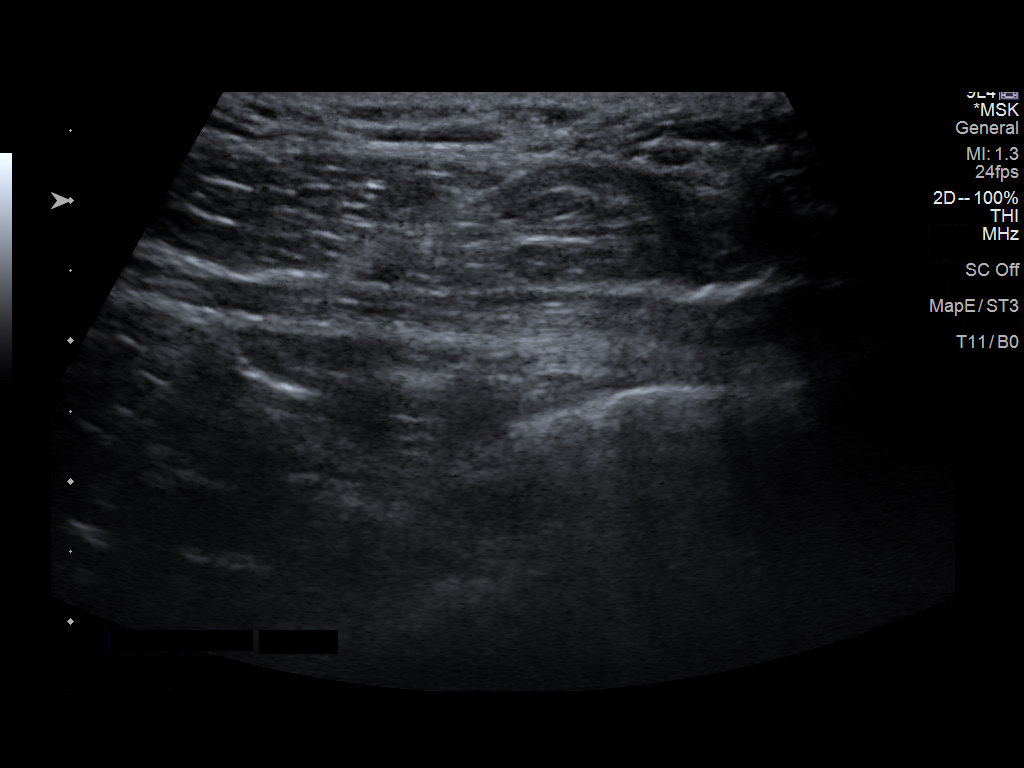
[im 6/7]
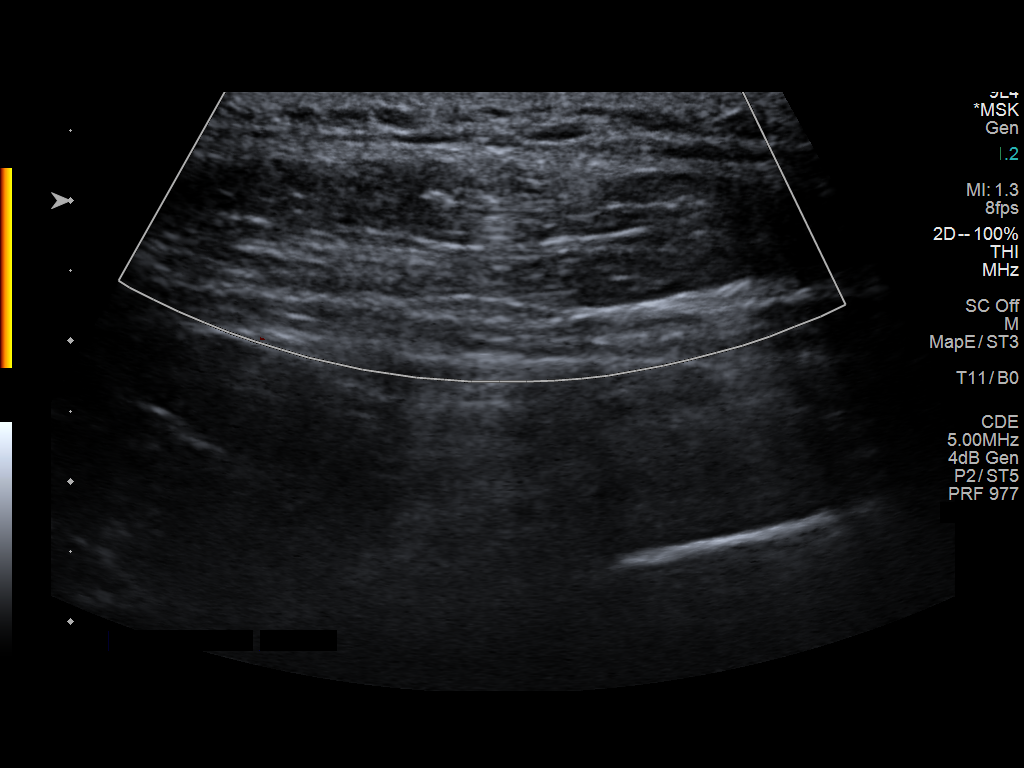
[im 7/7]
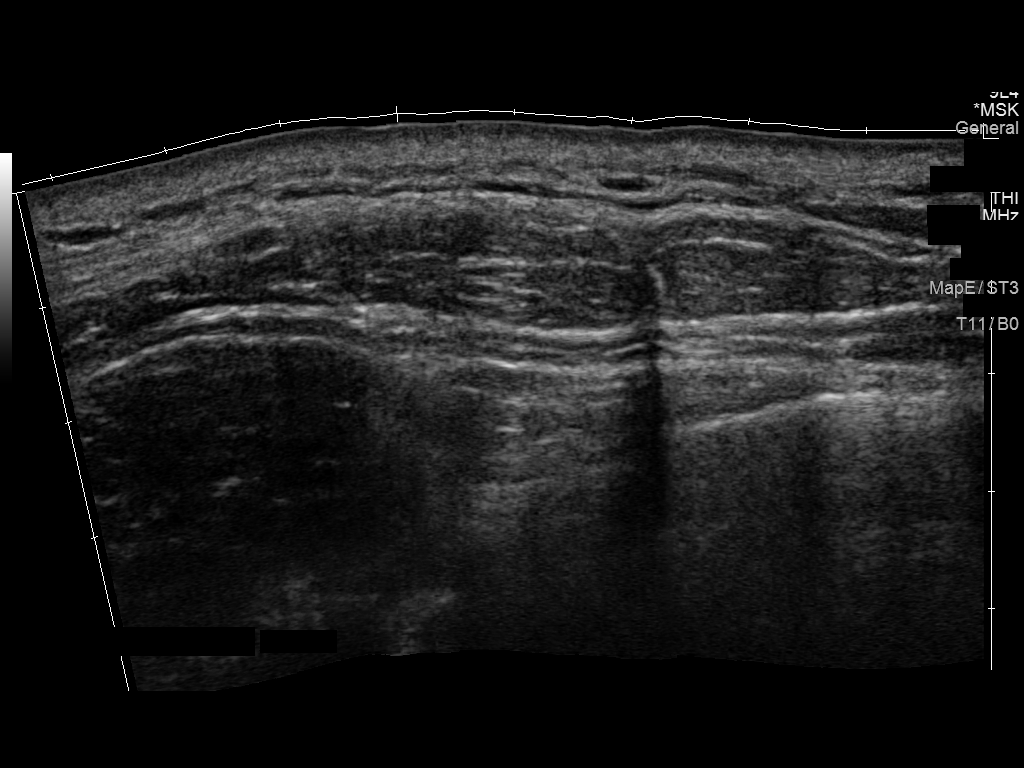

[7 of 7 positions shown; findings below may reference images not displayed]

FINDINGS: Scanning in the area of clinical concern shows a focal 5.0 x 3.7 x
2.2 cm mass lesion in the subcutaneous tissues likely representing a
focal lipoma.
IMPRESSION: Changes most consistent with a focal lipoma. Clinical follow-up is
recommended.

## 2023-04-03 ENCOUNTER — Ambulatory Visit: Payer: 59 | Admitting: Nurse Practitioner

## 2023-04-03 ENCOUNTER — Telehealth: Payer: Self-pay | Admitting: Nurse Practitioner

## 2023-04-03 NOTE — Telephone Encounter (Signed)
04/03/23 - No show letter sent.

## 2023-04-04 NOTE — Telephone Encounter (Signed)
Noted  

## 2023-04-04 NOTE — Telephone Encounter (Signed)
1st no show, letter sent via mail

## 2023-06-02 ENCOUNTER — Encounter (HOSPITAL_COMMUNITY): Payer: Self-pay | Admitting: Emergency Medicine

## 2023-06-02 ENCOUNTER — Emergency Department (HOSPITAL_COMMUNITY)
Admission: EM | Admit: 2023-06-02 | Discharge: 2023-06-03 | Disposition: A | Discharge status: Home / Self Care | Payer: 59 | Attending: Emergency Medicine | Admitting: Emergency Medicine

## 2023-06-02 ENCOUNTER — Emergency Department (HOSPITAL_COMMUNITY): Payer: 59

## 2023-06-02 ENCOUNTER — Other Ambulatory Visit: Payer: Self-pay

## 2023-06-02 DIAGNOSIS — R0789 Other chest pain: Secondary | ICD-10-CM | POA: Diagnosis not present

## 2023-06-02 DIAGNOSIS — R042 Hemoptysis: Secondary | ICD-10-CM | POA: Diagnosis present

## 2023-06-02 DIAGNOSIS — R0602 Shortness of breath: Secondary | ICD-10-CM | POA: Insufficient documentation
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
## 2023-06-03 NOTE — ED Notes (Signed)
Pt ambulated in hallway O2 95%

## 2023-06-03 NOTE — ED Notes (Signed)
Pt ambulated to bathroom without difficulty.
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
## 2023-06-05 ENCOUNTER — Telehealth: Payer: Self-pay

## 2023-06-05 NOTE — Transitions of Care (Post Inpatient/ED Visit) (Signed)
   06/05/2023  Name: Phillip Cabrera MRN: [id number] DOB: [birth year]  Today's TOC FU Call Status: Today's TOC FU Call Status:: Unsuccessful Call (1st Attempt) Unsuccessful Call (1st Attempt) Date: 06/05/23  Attempted to reach the patient regarding the most recent Inpatient/ED visit.  Follow Up Plan: Additional outreach attempts will be made to reach the patient to complete the Transitions of Care (Post Inpatient/ED visit) call.   Signature Arvil Persons, BSN, Charity fundraiser

## 2023-06-09 ENCOUNTER — Encounter: Payer: Self-pay | Admitting: Nurse Practitioner

## 2023-06-09 ENCOUNTER — Ambulatory Visit: Payer: 59 | Admitting: Nurse Practitioner

## 2023-06-09 VITALS — BP 134/68 | HR 60 | Temp 97.0°F | Resp 20 | Wt 218.2 lb

## 2023-06-09 DIAGNOSIS — G8929 Other chronic pain: Secondary | ICD-10-CM

## 2023-06-09 DIAGNOSIS — M25571 Pain in right ankle and joints of right foot: Secondary | ICD-10-CM

## 2023-06-09 DIAGNOSIS — R051 Acute cough: Secondary | ICD-10-CM

## 2023-06-09 MED ORDER — PROMETHAZINE-DM 6.25-15 MG/5ML PO SYRP: 5.0000 mL | ORAL_SOLUTION | Freq: Four times a day (QID) | ORAL | 0 refills | Status: DC | PRN

## 2023-06-09 MED ORDER — ALBUTEROL SULFATE HFA 108 (90 BASE) MCG/ACT IN AERS: 2.0000 | INHALATION_SPRAY | Freq: Four times a day (QID) | RESPIRATORY_TRACT | 0 refills | Status: DC | PRN
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
## 2023-06-09 NOTE — Patient Instructions (Signed)
It was great to see you!  Start promethazine dm every 4 hours as needed for cough  Start allegra daily  Start albuterol inhaler every 4-6 hours as needed for wheezing, coughing, shortness of breath  I ordered an xray of your ankle  Keep taking ibuprofen as needed and you can use an ACE wrap or ankle brace to help support it  Start doing ankle exercises daily by spelling the alphabet with your foot.   Let's follow-up if symptoms worsen or any concerns.   Take care,  Rodman Pickle, NP

## 2023-06-16 NOTE — Transitions of Care (Post Inpatient/ED Visit) (Signed)
   06/16/2023  Name: Phillip Cabrera MRN: [id number] DOB: [birth year]  Today's TOC FU Call Status: Today's TOC FU Call Status:: Unsuccessful Call (1st Attempt) Unsuccessful Call (1st Attempt) Date: 06/05/23  Attempted to reach the patient regarding the most recent Inpatient/ED visit.  Follow Up Plan: No further outreach attempts will be made at this time. We have been unable to contact the patient.  Signature Arvil Persons, BSN, Charity fundraiser

## 2023-07-01 ENCOUNTER — Other Ambulatory Visit: Payer: Self-pay | Admitting: Nurse Practitioner

## 2023-08-27 ENCOUNTER — Institutional Professional Consult (permissible substitution): Payer: 59 | Admitting: Internal Medicine

## 2023-10-27 ENCOUNTER — Encounter: Payer: Self-pay | Admitting: Nurse Practitioner

## 2023-10-27 ENCOUNTER — Ambulatory Visit (INDEPENDENT_AMBULATORY_CARE_PROVIDER_SITE_OTHER): Admitting: Nurse Practitioner

## 2023-10-27 VITALS — BP 122/70 | HR 68 | Temp 97.6°F | Ht 68.0 in | Wt 217.4 lb

## 2023-10-27 DIAGNOSIS — R5383 Other fatigue: Secondary | ICD-10-CM | POA: Diagnosis not present

## 2023-10-27 DIAGNOSIS — Z13220 Encounter for screening for lipoid disorders: Secondary | ICD-10-CM

## 2023-10-27 DIAGNOSIS — Z113 Encounter for screening for infections with a predominantly sexual mode of transmission: Secondary | ICD-10-CM

## 2023-10-27 DIAGNOSIS — E669 Obesity, unspecified: Secondary | ICD-10-CM | POA: Diagnosis not present

## 2023-10-27 DIAGNOSIS — Z0000 Encounter for general adult medical examination without abnormal findings: Secondary | ICD-10-CM | POA: Diagnosis not present

## 2023-10-27 NOTE — Patient Instructions (Signed)
 It was great to see you!  Come back for labs between 8am-10am. You can schedule this appointment on the way out  Let's follow-up in 1 year, sooner if you have concerns.  If a referral was placed today, you will be contacted for an appointment. Please note that routine referrals can sometimes take up to 3-4 weeks to process. Please call our office if you haven't heard anything after this time frame.  Take care,  Rodman Pickle, NP

## 2023-10-27 NOTE — Assessment & Plan Note (Signed)
BMI 33.  Discussed nutrition, exercise.

## 2023-10-27 NOTE — Progress Notes (Signed)
 BP 122/70 (BP Location: Left Arm, Patient Position: Sitting, Cuff Size: Normal)   Pulse 68   Temp 97.6 F (36.4 C)   Ht 5\' 8"  (1.727 m)   Wt 217 lb 6.4 oz (98.6 kg)   SpO2 97%   BMI 33.06 kg/m    Subjective:    Patient ID: Phillip Cabrera, male    DOB: August 22, 1990, 33 y.o.   MRN: [id number]  CC: Chief Complaint  Patient presents with   Annual Exam    With lab work-patient is not fasting    HPI: Phillip Cabrera is a 33 y.o. male presenting on 10/27/2023 for comprehensive medical examination. Current medical complaints include: fatigue  He has noticed some fatigue and weight gain. He is concerned about his testosterone levels and would like this checked. He has been trying to eat healthier and exercise for the last 3 weeks.   He currently lives with: mom & brother  Depression and Anxiety Screen done today and results listed below:     10/27/2023    3:35 PM 06/09/2023   10:56 AM 10/19/2021    4:13 PM 09/21/2021    3:49 PM  Depression screen PHQ 2/9  Decreased Interest 1 0 0 0  Down, Depressed, Hopeless 0 0 0 0  PHQ - 2 Score 1 0 0 0  Altered sleeping 0  1   Tired, decreased energy 1  1   Change in appetite 1  0   Feeling bad or failure about yourself  0  0   Trouble concentrating 0  0   Moving slowly or fidgety/restless 0  0   Suicidal thoughts 0  0   PHQ-9 Score 3  2   Difficult doing work/chores Not difficult at all  Not difficult at all       10/27/2023    3:35 PM 10/19/2021    4:13 PM 09/21/2021    3:49 PM  GAD 7 : Generalized Anxiety Score  Nervous, Anxious, on Edge 0 1 1  Control/stop worrying 0 0 0  Worry too much - different things 0 0 0  Trouble relaxing 0 0 0  Restless 0 0 0  Easily annoyed or irritable 0 0 0  Afraid - awful might happen 0 0 0  Total GAD 7 Score 0 1 1  Anxiety Difficulty Not difficult at all Not difficult at all Not difficult at all    The patient does not have a history of falls. I did not complete a risk assessment for falls. A plan of  care for falls was not documented.   Past Medical History:  Past Medical History:  Diagnosis Date   History of hand fracture     Surgical History:  Past Surgical History:  Procedure Laterality Date   HAND SURGERY      Medications:  Current Outpatient Medications on File Prior to Visit  Medication Sig   albuterol (VENTOLIN HFA) 108 (90 Base) MCG/ACT inhaler INHALE 2 PUFFS INTO THE LUNGS EVERY 6 HOURS AS NEEDED FOR WHEEZING OR SHORTNESS OF BREATH (Patient not taking: Reported on 10/27/2023)   cetirizine (ZYRTEC) 10 MG tablet Take 10 mg by mouth daily. (Patient not taking: Reported on 10/27/2023)   No current facility-administered medications on file prior to visit.    Allergies:  No Known Allergies  Social History:  Social History   Socioeconomic History   Marital status: Single    Spouse name: Not on file   Number of children: Not on file  Years of education: Not on file   Highest education level: Not on file  Occupational History   Not on file  Tobacco Use   Smoking status: Never   Smokeless tobacco: Former    Types: Snuff  Vaping Use   Vaping status: Never Used  Substance and Sexual Activity   Alcohol use: Not Currently    Comment: Stopped in 2020   Drug use: No   Sexual activity: Not on file  Other Topics Concern   Not on file  Social History Narrative   Not on file   Social Drivers of Health   Financial Resource Strain: Not on file  Food Insecurity: Not on file  Transportation Needs: Not on file  Physical Activity: Not on file  Stress: Not on file  Social Connections: Unknown (12/30/2021)   [facility]   Social Network    Social Network: Not on file  Intimate Partner Violence: Unknown (11/20/2021)   Received from [facility]   HITS    Physically Hurt: Not on file    Insult or Talk Down To: Not on file    Threaten Physical Harm: Not on file    Scream or Curse: Not on file   Social History   Tobacco Use  Smoking Status Never   Smokeless Tobacco Former   Types: Snuff   Social History   Substance and Sexual Activity  Alcohol Use Not Currently   Comment: Stopped in 2020    Family History:  Family History  Problem Relation Age of Onset   Cancer Maternal Aunt        spinal   Cancer Paternal Grandmother        breast    Past medical history, surgical history, medications, allergies, family history and social history reviewed with patient today and changes made to appropriate areas of the chart.   Review of Systems  Constitutional:  Positive for malaise/fatigue. Negative for fever.  HENT: Negative.    Eyes: Negative.   Respiratory: Negative.    Cardiovascular: Negative.   Gastrointestinal: Negative.   Genitourinary: Negative.   Musculoskeletal: Negative.   Skin: Negative.   Neurological: Negative.   Psychiatric/Behavioral: Negative.     All other ROS negative except what is listed above and in the HPI.      Objective:    BP 122/70 (BP Location: Left Arm, Patient Position: Sitting, Cuff Size: Normal)   Pulse 68   Temp 97.6 F (36.4 C)   Ht 5\' 8"  (1.727 m)   Wt 217 lb 6.4 oz (98.6 kg)   SpO2 97%   BMI 33.06 kg/m   Wt Readings from Last 3 Encounters:  10/27/23 217 lb 6.4 oz (98.6 kg)  06/09/23 218 lb 3.2 oz (99 kg)  06/02/23 210 lb (95.3 kg)    Physical Exam Vitals and nursing note reviewed.  Constitutional:      General: He is not in acute distress.    Appearance: Normal appearance.  HENT:     Head: Normocephalic and atraumatic.     Right Ear: Tympanic membrane, ear canal and external ear normal.     Left Ear: Tympanic membrane, ear canal and external ear normal.  Eyes:     Conjunctiva/sclera: Conjunctivae normal.  Cardiovascular:     Rate and Rhythm: Normal rate and regular rhythm.     Pulses: Normal pulses.     Heart sounds: Normal heart sounds.  Pulmonary:     Effort: Pulmonary effort is normal.  Breath sounds: Normal breath sounds.  Abdominal:     Palpations: Abdomen  is soft.     Tenderness: There is no abdominal tenderness.  Musculoskeletal:        General: Normal range of motion.     Cervical back: Normal range of motion and neck supple. No tenderness.     Right lower leg: No edema.     Left lower leg: No edema.  Lymphadenopathy:     Cervical: No cervical adenopathy.  Skin:    General: Skin is warm and dry.  Neurological:     General: No focal deficit present.     Mental Status: He is alert and oriented to person, place, and time.     Cranial Nerves: No cranial nerve deficit.     Coordination: Coordination normal.     Gait: Gait normal.  Psychiatric:        Mood and Affect: Mood normal.        Behavior: Behavior normal.        Thought Content: Thought content normal.        Judgment: Judgment normal.     Results for orders placed or performed in visit on 10/19/21  CBC   Collection Time: 10/19/21  4:05 PM  Result Value Ref Range   WBC 7.8 3.8 - 10.8 Thousand/uL   RBC 4.76 4.20 - 5.80 Million/uL   Hemoglobin 14.8 13.2 - 17.1 g/dL   HCT 16.1 09.6 - 04.5 %   MCV 90.3 80.0 - 100.0 fL   MCH 31.1 27.0 - 33.0 pg   MCHC 34.4 32.0 - 36.0 g/dL   RDW 40.9 81.1 - 91.4 %   Platelets 255 140 - 400 Thousand/uL   MPV 10.7 7.5 - 12.5 fL  Comprehensive metabolic panel   Collection Time: 10/19/21  4:05 PM  Result Value Ref Range   Glucose, Bld 86 65 - 99 mg/dL   BUN 22 7 - 25 mg/dL   Creat 7.82 9.56 - 2.13 mg/dL   BUN/Creatinine Ratio NOT APPLICABLE 6 - 22 (calc)   Sodium 141 135 - 146 mmol/L   Potassium 4.1 3.5 - 5.3 mmol/L   Chloride 105 98 - 110 mmol/L   CO2 24 20 - 32 mmol/L   Calcium 9.5 8.6 - 10.3 mg/dL   Total Protein 7.0 6.1 - 8.1 g/dL   Albumin 4.7 3.6 - 5.1 g/dL   Globulin 2.3 1.9 - 3.7 g/dL (calc)   AG Ratio 2.0 1.0 - 2.5 (calc)   Total Bilirubin 0.5 0.2 - 1.2 mg/dL   Alkaline phosphatase (APISO) 82 36 - 130 U/L   AST 19 10 - 40 U/L   ALT 19 9 - 46 U/L  Hepatitis C antibody   Collection Time: 10/19/21  4:05 PM  Result Value  Ref Range   Hepatitis C Ab NON-REACTIVE NON-REACTIVE   SIGNAL TO CUT-OFF 0.03 <1.00  HIV Antibody (routine testing w rflx)   Collection Time: 10/19/21  4:05 PM  Result Value Ref Range   HIV 1&2 Ab, 4th Generation NON-REACTIVE NON-REACTIVE  Lipid panel   Collection Time: 10/19/21  4:05 PM  Result Value Ref Range   Cholesterol 173 <200 mg/dL   HDL 40 > OR = 40 mg/dL   Triglycerides 086 (H) <150 mg/dL   LDL Cholesterol (Calc) 104 (H) mg/dL (calc)   Total CHOL/HDL Ratio 4.3 <5.0 (calc)   Non-HDL Cholesterol (Calc) 133 (H) <130 mg/dL (calc)      Assessment & Plan:   Problem List  Items Addressed This Visit       Other   Routine general medical examination at a health care facility - Primary   Obesity (BMI 30-39.9)   BMI 33. Discussed nutrition, exercise.       Other Visit Diagnoses       Fatigue, unspecified type       Check CMP, CBC, testosterone. Will need to come back for lab visit before 10am. Continue healthy eating and exercise.   Relevant Orders   CBC with Differential/Platelet   Comprehensive metabolic panel   Testosterone     Screen for STD (sexually transmitted disease)       Screen STD per patient request, no current symptoms   Relevant Orders   HIV Antibody (routine testing w rflx)   Urine cytology ancillary only     Screening, lipid       Screen baseline lipid panel   Relevant Orders   Lipid panel       IMMUNIZATIONS:   - Tdap: Tetanus vaccination status reviewed: last tetanus booster within 10 years. - Influenza: Declined - Pneumovax: Not applicable - Prevnar: Not applicable - HPV: Up to date - Shingrix vaccine: Not applicable  SCREENING: - Colonoscopy: Not applicable  Discussed with patient purpose of the colonoscopy is to detect colon cancer at curable precancerous or early stages   - AAA Screening: Not applicable   PATIENT COUNSELING:    Sexuality: Discussed sexually transmitted diseases, partner selection, use of condoms, avoidance of  unintended pregnancy  and contraceptive alternatives.   Advised to avoid cigarette smoking.  I discussed with the patient that most people either abstain from alcohol or drink within safe limits (<=14/week and <=4 drinks/occasion for males, <=7/weeks and <= 3 drinks/occasion for females) and that the risk for alcohol disorders and other health effects rises proportionally with the number of drinks per week and how often a drinker exceeds daily limits.  Discussed cessation/primary prevention of drug use and availability of treatment for abuse.   Diet: Encouraged to adjust caloric intake to maintain  or achieve ideal body weight, to reduce intake of dietary saturated fat and total fat, to limit sodium intake by avoiding high sodium foods and not adding table salt, and to maintain adequate dietary potassium and calcium preferably from fresh fruits, vegetables, and low-fat dairy products.    stressed the importance of regular exercise  Injury prevention: Discussed safety belts, safety helmets, smoke detector, smoking near bedding or upholstery.   Dental health: Discussed importance of regular tooth brushing, flossing, and dental visits.   Follow up plan: NEXT PREVENTATIVE PHYSICAL DUE IN 1 YEAR. Return in about 1 year (around 10/26/2024) for CPE.  Klark Vanderhoef A Kaylise Blakeley

## 2023-10-29 ENCOUNTER — Other Ambulatory Visit (HOSPITAL_COMMUNITY)
Admission: RE | Admit: 2023-10-29 | Discharge: 2023-10-29 | Disposition: A | Discharge status: Home / Self Care | Source: Ambulatory Visit | Attending: Nurse Practitioner | Admitting: Nurse Practitioner

## 2023-10-29 ENCOUNTER — Encounter: Payer: Self-pay | Admitting: Nurse Practitioner

## 2023-10-29 ENCOUNTER — Other Ambulatory Visit (INDEPENDENT_AMBULATORY_CARE_PROVIDER_SITE_OTHER)

## 2023-10-29 DIAGNOSIS — Z113 Encounter for screening for infections with a predominantly sexual mode of transmission: Secondary | ICD-10-CM | POA: Diagnosis present

## 2023-10-29 DIAGNOSIS — R7989 Other specified abnormal findings of blood chemistry: Secondary | ICD-10-CM

## 2023-10-29 DIAGNOSIS — R5383 Other fatigue: Secondary | ICD-10-CM | POA: Diagnosis not present

## 2023-10-29 DIAGNOSIS — Z13220 Encounter for screening for lipoid disorders: Secondary | ICD-10-CM

## 2023-10-29 LAB — COMPREHENSIVE METABOLIC PANEL
ALT: 37 U/L (ref 0–53)
AST: 34 U/L (ref 0–37)
Albumin: 5 g/dL (ref 3.5–5.2)
Alkaline Phosphatase: 83 U/L (ref 39–117)
BUN: 25 mg/dL — ABNORMAL HIGH (ref 6–23)
CO2: 24 meq/L (ref 19–32)
Calcium: 9.7 mg/dL (ref 8.4–10.5)
Chloride: 105 meq/L (ref 96–112)
Creatinine, Ser: 1.16 mg/dL (ref 0.40–1.50)
GFR: 83.88 mL/min (ref >60.00)
Glucose, Bld: 94 mg/dL (ref 70–99)
Potassium: 4.3 meq/L (ref 3.5–5.1)
Sodium: 139 meq/L (ref 135–145)
Total Bilirubin: 0.6 mg/dL (ref 0.2–1.2)
Total Protein: 7.2 g/dL (ref 6.0–8.3)

## 2023-10-29 LAB — LIPID PANEL
Cholesterol: 181 mg/dL (ref 0–200)
HDL: 37.3 mg/dL — ABNORMAL LOW (ref >39.00)
LDL Cholesterol: 128 mg/dL — ABNORMAL HIGH (ref 0–99)
NonHDL: 143.46
Total CHOL/HDL Ratio: 5
Triglycerides: 76 mg/dL (ref 0.0–149.0)
VLDL: 15.2 mg/dL (ref 0.0–40.0)

## 2023-10-29 LAB — CBC WITH DIFFERENTIAL/PLATELET
Basophils Absolute: 0 10*3/uL (ref 0.0–0.1)
Basophils Relative: 0.6 % (ref 0.0–3.0)
Eosinophils Absolute: 0.1 10*3/uL (ref 0.0–0.7)
Eosinophils Relative: 2.3 % (ref 0.0–5.0)
HCT: 45.9 % (ref 39.0–52.0)
Hemoglobin: 15.3 g/dL (ref 13.0–17.0)
Lymphocytes Relative: 43.6 % (ref 12.0–46.0)
Lymphs Abs: 2.4 10*3/uL (ref 0.7–4.0)
MCHC: 33.3 g/dL (ref 30.0–36.0)
MCV: 93 fl (ref 78.0–100.0)
Monocytes Absolute: 0.4 10*3/uL (ref 0.1–1.0)
Monocytes Relative: 7.4 % (ref 3.0–12.0)
Neutro Abs: 2.5 10*3/uL (ref 1.4–7.7)
Neutrophils Relative %: 46.1 % (ref 43.0–77.0)
Platelets: 285 10*3/uL (ref 150.0–400.0)
RBC: 4.94 Mil/uL (ref 4.22–5.81)
RDW: 12.8 % (ref 11.5–15.5)
WBC: 5.4 10*3/uL (ref 4.0–10.5)

## 2023-10-29 LAB — TESTOSTERONE: Testosterone: 266.25 ng/dL — ABNORMAL LOW (ref 300.00–890.00)

## 2023-10-29 NOTE — Addendum Note (Signed)
 Addended by: Rodman Pickle A on: 10/29/2023 04:27 PM   Modules accepted: Orders

## 2023-10-30 LAB — URINE CYTOLOGY ANCILLARY ONLY
Chlamydia: NEGATIVE
Comment: NEGATIVE
Comment: NORMAL
Neisseria Gonorrhea: NEGATIVE

## 2023-10-30 LAB — HIV ANTIBODY (ROUTINE TESTING W REFLEX): HIV 1&2 Ab, 4th Generation: NONREACTIVE

## 2023-11-12 ENCOUNTER — Other Ambulatory Visit (INDEPENDENT_AMBULATORY_CARE_PROVIDER_SITE_OTHER)

## 2023-11-12 DIAGNOSIS — R7989 Other specified abnormal findings of blood chemistry: Secondary | ICD-10-CM

## 2023-11-13 ENCOUNTER — Ambulatory Visit: Payer: Self-pay

## 2023-11-13 LAB — TESTOSTERONE: Testosterone: 263.17 ng/dL — ABNORMAL LOW (ref 300.00–890.00)

## 2023-11-13 NOTE — Telephone Encounter (Signed)
 Noted. Patient was scheduled for an appointment tomorrow with Dr. Veto Kemps.

## 2023-11-13 NOTE — Telephone Encounter (Signed)
 Copied from CRM [id number]. Topic: Clinical - Red Word Triage >> Nov 13, 2023 11:49 AM Arley Phenix D wrote: Red Word that prompted transfer to Nurse Triage: extreme pain.  Patient stated that he is having pain on his left side. Patient stated that he was working out and heard/felt a pop. Patient stated it hurts around ribs and in front of his ribs. Patient stated he can stand and sit but when he bends over its extreme pain. Patient wanted to make an appointment but provider only had availability for next week.  Chief Complaint: body pain Symptoms: pain located upper abd/rib cage area - moderate pain Frequency: since Tuesday Pertinent Negatives: Patient denies numbness or tingling Disposition: [] ED /[] Urgent Care (no appt availability in office) / [x] Appointment(In office/virtual)/ []  [facility]/ [] Home Care/ [] Refused Recommended Disposition /[] [facility]/ []  Follow-up with PCP Additional Notes: pt stated while working out doing leg presses he heard a pop then pain began.  Pain more in sitting position. If stretch to right might hear a click sound - like it is fixing whatever is wrong then it starts back hurting. Reason for Disposition  [1] MODERATE pain (e.g., interferes with normal activities) AND [2] present > 3 days    Pain started while working out doing leg presses: pt stated heard a pop then pain started.  Answer Assessment - Initial Assessment Questions 1. ONSET: "When did the muscle aches or body pains start?"      Tuesday 2. LOCATION: "What part of your body is hurting?" (e.g., entire body, arms, legs)      Upper abd/ left ribs - just below sternum  3. SEVERITY: "How bad is the pain?" (Scale 1-10; or mild, moderate, severe)   - MILD (1-3): doesn't interfere with normal activities    - MODERATE (4-7): interferes with normal activities or awakens from sleep    - SEVERE (8-10):  excruciating pain, unable to do any normal activities      severe 4. CAUSE: "What do you  think is causing the pains?"     movement 5. FEVER: "Have you been having fever?"     N/a 6. OTHER SYMPTOMS: "Do you have any other symptoms?" (e.g., chest pain, weakness, rash, cold or flu symptoms, weight loss)     no 7. PREGNANCY: "Is there any chance you are pregnant?" "When was your last menstrual period?"     N/a 8. TRAVEL: "Have you traveled out of the country in the last month?" (e.g., travel history, exposures)     N/a  Protocols used: Muscle Aches and Body Pain-A-AH

## 2023-11-14 ENCOUNTER — Encounter: Payer: Self-pay | Admitting: Nurse Practitioner

## 2023-11-14 ENCOUNTER — Ambulatory Visit (INDEPENDENT_AMBULATORY_CARE_PROVIDER_SITE_OTHER): Admitting: Family Medicine

## 2023-11-14 ENCOUNTER — Encounter: Payer: Self-pay | Admitting: Family Medicine

## 2023-11-14 VITALS — BP 116/68 | HR 86 | Temp 97.8°F | Ht 68.0 in | Wt 212.2 lb

## 2023-11-14 DIAGNOSIS — R0789 Other chest pain: Secondary | ICD-10-CM | POA: Insufficient documentation

## 2023-11-14 MED ORDER — NAPROXEN 500 MG PO TABS: 500.0000 mg | ORAL_TABLET | Freq: Two times a day (BID) | ORAL | 0 refills | Status: DC

## 2023-11-14 NOTE — Assessment & Plan Note (Signed)
 I reassured Phillip Cabrera that there is no sign of a hernia present. I suspect that he had an injury to the cartilaginous portion of his rib cage. I recommended he take a break from weight lifting over the next week. I will prescribed naproxen 500 mg bid for 7 days. He should also use heat on this area. When resuming is weight training, he should keep his weight load lower, increase this gradually by small increments, and strongly consider involving a professional trainer to help him reach his body building goals with out significant injury.

## 2023-11-14 NOTE — Progress Notes (Signed)
 [facility] [address] [place] [address] Dept: [phone] Dept Fax: [phone]  Office Visit  Subjective:    Patient ID: Phillip Cabrera, male    DOB: 05-27-1991, 33 y.o..   MRN: [id number]  Chief Complaint  Patient presents with   Rib Injury    C/o having pain on RT side rib pain x 3 days. No OTC meds taken.      History of Present Illness:  Patient is in today having had an injury develop while weight training three days ago. Mr. Martino notes he was on a leg press machine with 410 lbs. of weight. He admits that he did allow his legs to flex deeper than is recommended. As he was pushing up, he heard a loud pop and had sudden pain along the lower left costal margin of the chest. He has continued to have some pain in this area with direct palpation and with certain movements.  Past Medical History: Patient Active Problem List   Diagnosis Date Noted   Routine general medical examination at a health care facility 10/27/2023   Obesity (BMI 30-39.9) 10/27/2023   Lump of skin of back 09/23/2021   Chronic right shoulder pain 09/23/2021   Right elbow pain 09/23/2021   Past Surgical History:  Procedure Laterality Date   HAND SURGERY     Family History  Problem Relation Age of Onset   Cancer Maternal Aunt        spinal   Cancer Paternal Grandmother        breast   Outpatient Medications Prior to Visit  Medication Sig Dispense Refill   albuterol (VENTOLIN HFA) 108 (90 Base) MCG/ACT inhaler INHALE 2 PUFFS INTO THE LUNGS EVERY 6 HOURS AS NEEDED FOR WHEEZING OR SHORTNESS OF BREATH 8 g 2   cetirizine (ZYRTEC) 10 MG tablet Take 10 mg by mouth daily.     No facility-administered medications prior to visit.   No Known Allergies   Objective:   Today's Vitals   11/14/23 1311  BP: 116/68  Pulse: 86  Temp: 97.8 F (36.6 C)  TempSrc: Temporal  SpO2: 96%  Weight: 212 lb 3.2 oz (96.3 kg)  Height: 5\' 8"  (1.727 m)   Body  mass index is 32.26 kg/m.   General: Well developed, well nourished. No acute distress. Chest; There is pain on palpation over the lower edge of the left chest. No palpable. step off. Mild pain   with AP compression. Mild swelling present. Psych: Alert and oriented. Normal mood and affect.  There are no preventive care reminders to display for this patient.  Assessment & Plan:   Problem List Items Addressed This Visit       Other   Chest wall pain - Primary   I reassured Mr. Taglieri that there is no sign of a hernia present. I suspect that he had an injury to the cartilaginous portion of his rib cage. I recommended he take a break from weight lifting over the next week. I will prescribed naproxen 500 mg bid for 7 days. He should also use heat on this area. When resuming is weight training, he should keep his weight load lower, increase this gradually by small increments, and strongly consider involving a professional trainer to help him reach his body building goals with out significant injury.      Relevant Medications   naproxen (NAPROSYN) 500 MG tablet    Return if symptoms worsen or fail to improve.  Loyola Mast, MD

## 2023-11-14 NOTE — Addendum Note (Signed)
 Addended by: Rodman Pickle A on: 11/14/2023 09:26 AM   Modules accepted: Orders

## 2023-11-18 ENCOUNTER — Other Ambulatory Visit: Payer: Self-pay | Admitting: Family Medicine

## 2023-11-18 DIAGNOSIS — R0789 Other chest pain: Secondary | ICD-10-CM

## 2023-11-23 NOTE — Progress Notes (Signed)
    Chief Complaint: No chief complaint on file.   History of Present Illness:  Phillip Cabrera is a 33 y.o. male who is seen in consultation from [facility], Lauren A, NP for evaluation of low testosterone levels.  Most recent testosterone levels drawn appropriately before 10 AM several days apart in March, 2025.  Levels were 266 and 263, respectively.   Past Medical History:  Past Medical History:  Diagnosis Date   History of hand fracture     Past Surgical History:  Past Surgical History:  Procedure Laterality Date   HAND SURGERY      Allergies:  No Known Allergies  Family History:  Family History  Problem Relation Age of Onset   Cancer Maternal Aunt        spinal   Cancer Paternal Grandmother        breast    Social History:  Social History   Tobacco Use   Smoking status: Never   Smokeless tobacco: Former    Types: Snuff    Quit date: 11/12/2018  Vaping Use   Vaping status: Never Used  Substance Use Topics   Alcohol use: Not Currently    Comment: Stopped in 2020   Drug use: No    Review of symptoms:  Constitutional:  Negative for unexplained weight loss, night sweats, fever, chills ENT:  Negative for nose bleeds, sinus pain, painful swallowing CV:  Negative for chest pain, shortness of breath, exercise intolerance, palpitations, loss of consciousness Resp:  Negative for cough, wheezing, shortness of breath GI:  Negative for nausea, vomiting, diarrhea, bloody stools GU:  Positives noted in HPI; otherwise negative for gross hematuria, dysuria, urinary incontinence Neuro:  Negative for seizures, poor balance, limb weakness, slurred speech Psych:  Negative for lack of energy, depression, anxiety Endocrine:  Negative for polydipsia, polyuria, symptoms of hypoglycemia (dizziness, hunger, sweating) Hematologic:  Negative for anemia, purpura, petechia, prolonged or excessive bleeding, use of anticoagulants  Allergic:  Negative for difficulty breathing or choking  as a result of exposure to anything; no shellfish allergy; no allergic response (rash/itch) to materials, foods  Physical exam: There were no vitals taken for this visit. GENERAL APPEARANCE:  Well appearing, well developed, well nourished, NAD HEENT: Atraumatic, Normocephalic. NECK: Normal appearance LUNGS: Normal inspiratory and expiratory excursion HEART: Regular Rate ABDOMEN: No inguinal hernia  GU: Phallus normal, no lesions. Scrotal skin normal. Testicles/epididymal structures normal. Meatusslightly stenotic. EXTREMITIES: Moves all extremities well.  Without clubbing, cyanosis, or edema. NEUROLOGIC:  Alert and oriented x 3, normal gait, CN II-XII grossly intact.  MENTAL STATUS:  Appropriate. SKIN:  Warm, dry and intact.    Results: Referring MD notes reviewed  Testosterone levels reviewed  Assessment: Hypogonadism, symptomatic.   Plan: I discussed repletion with him-methods of replacement were discussed as well.  Injections, transdermal preparations, oral medications as well.  He would like to go with injections.  I will start him on testosterone cypionate 100 mg IM weekly.  We will bring him in for nurse visit to instruct on injection techniques, I will see him back in 2 months following laboratories-hemoglobin/hematocrit, testosterone, prolactin

## 2023-11-24 ENCOUNTER — Ambulatory Visit (INDEPENDENT_AMBULATORY_CARE_PROVIDER_SITE_OTHER): Admitting: Urology

## 2023-11-24 ENCOUNTER — Encounter: Payer: Self-pay | Admitting: Urology

## 2023-11-24 VITALS — BP 158/69 | HR 74 | Ht 69.5 in | Wt 211.0 lb

## 2023-11-24 DIAGNOSIS — E291 Testicular hypofunction: Secondary | ICD-10-CM

## 2023-11-24 MED ORDER — TESTOSTERONE CYPIONATE 200 MG/ML IM SOLN: 100.0000 mg | INTRAMUSCULAR | 1 refills | Status: DC

## 2023-12-01 ENCOUNTER — Ambulatory Visit (INDEPENDENT_AMBULATORY_CARE_PROVIDER_SITE_OTHER): Admitting: Urology

## 2023-12-01 DIAGNOSIS — E291 Testicular hypofunction: Secondary | ICD-10-CM

## 2023-12-01 NOTE — Progress Notes (Signed)
 Phillip Cabrera is a 33 y.o.  male with testosterone  deficiency who presents today for instruction on delivering an IM injection of testosterone  cypionate.    I instructed the patient to identify the concentration of his testosterone . Testosterone  for injection is usually in the form of testosterone  cypionate. These liquids come in multiple concentrations, so before giving an injection, it's very important to make sure that his intended dosage takes into account the concentration of the testosterone  serum. Usually, testosterone  comes in a concentration of either 100 mg/ml or 200 mg/ml.  We typically use the 200 mg/mL in this office.    Using a sterile, 18 G needle and 3 cc syringe, the testosterone  cypionate was drawn up for a 0.5  cc injection.  To draw up the dose, I demonstrated how to first draw air into the syringe equal to the volume of the dosage. Then, wipe the top of the medication bottle with an alcohol wipe, insert the needle through the lid and into the medication, and push the air from your syringe into the bottle. Turn the bottle upside down and draw out the exact dosage of testosterone .  I demonstrated how to aspirate the syringe by hinge the syringe with its needle uncapped and pointing up in front of him.  Looking for air bubbles in the syringe. Flick the side of the syringe to get these bubbles to rise to the top.    When the dosage is bubble-free, I slowly depressed the plunger to force the air at the top of the syringe out stopping when a tiny drop of medication comes out of the tip of the syringe.  I advised him to be certain no air remained in the syringe as injecting air is very dangerous.  Being careful not to squirt or spray a significant portion of the dosage onto the floor.  Preparing the injection site, outer middle third of the vastus lateralis muscle of the thigh, I took a sterile alcohol pad and wipe the immediate area around where I intended him to inject.   I then  demonstrated how to change the needle from the 18 G to the 21 G needle.  I then gave him the syringe for injecting.  He correctly identified the injection location.  He held the syringe like a dart at a 90-degree angle above the sterile injection site. Quickly plunged it into the flesh. Before depressing the plunger, he drew back on it slightly and no blood was seen.  I advised him that if blood flashed in the syringe, he would need to remove the needle and then select a different location as he was in the vein.  Inject the medication at a steady, controlled pace.  He fully depressed the plunger, slowly pull the needle out. Pressing around the injection site with a sterile cotton swab as he did so - this preventing the emerging needle from pulling on the skin and causing extra pain.  We assessed the needle entry point for bleeding, and applied a sterile Band-Aid and/or cotton swab if needed. Disposed of the used needle and syringe in a proper sharps container.  I advised him to acquire a sharps container for his personal use at home.  I advised him that If, after injection, he experienced redness, swelling, or discomfort beyond that of normal soreness at the site of injection, call our office for an appointment and instructions.  He is to always store his medication at the recommended temperature, and always check the expiration date  on the bottle. If it's expired, don't use it.  Of course, keep all of med's out of reach of children.  Do not change his dose without consulting your provider.  His starting dose will be 0.5 cc every 1 weeks.    I agree with the above note

## 2023-12-01 NOTE — Patient Instructions (Signed)
 Using a sterile, 18 G needle and 3 cc syringe, the testosterone cypionate was drawn up for a 0.5  cc injection.  To draw up the dose, I demonstrated how to first draw air into the syringe equal to the volume of the dosage. Then, wipe the top of the medication bottle with an alcohol wipe, insert the needle through the lid and into the medication, and push the air from your syringe into the bottle. Turn the bottle upside down and draw out the exact dosage of testosterone.   I demonstrated how to aspirate the syringe by hinge the syringe with its needle uncapped and pointing up in front of him.  Looking for air bubbles in the syringe. Flick the side of the syringe to get these bubbles to rise to the top.     When the dosage is bubble-free, I slowly depressed the plunger to force the air at the top of the syringe out stopping when a tiny drop of medication comes out of the tip of the syringe.  I advised him to be certain no air remained in the syringe as injecting air is very dangerous.  Being careful not to squirt or spray a significant portion of the dosage onto the floor.   Preparing the injection site, outer middle third of the vastus lateralis muscle of the thigh, I took a sterile alcohol pad and wipe the immediate area around where I intended him to inject.    I then demonstrated how to change the needle from the 18 G to the 21 G needle.   I then gave him the syringe for injecting.  He correctly identified the injection location.   He held the syringe like a dart at a 90-degree angle above the sterile injection site. Quickly plunged it into the flesh. Before depressing the plunger, he drew back on it slightly and no blood was seen.  I advised him that if blood flashed in the syringe, he would need to remove the needle and then select a different location as he was in the vein.  Inject the medication at a steady, controlled pace.   He fully depressed the plunger, slowly pull the needle out. Pressing  around the injection site with a sterile cotton swab as he did so - this preventing the emerging needle from pulling on the skin and causing extra pain.  We assessed the needle entry point for bleeding, and applied a sterile Band-Aid and/or cotton swab if needed. Disposed of the used needle and syringe in a proper sharps container.  I advised him to acquire a sharps container for his personal use at home.   I advised him that If, after injection, he experienced redness, swelling, or discomfort beyond that of normal soreness at the site of injection, call our office for an appointment and instructions.   He is to always store his medication at the recommended temperature, and always check the expiration date on the bottle. If it's expired, don't use it.  Of course, keep all of med's out of reach of children.  Do not change his dose without consulting your provider.   His starting dose will be 0.5 cc every 1 weeks.

## 2023-12-03 ENCOUNTER — Other Ambulatory Visit

## 2023-12-03 DIAGNOSIS — E291 Testicular hypofunction: Secondary | ICD-10-CM

## 2023-12-04 LAB — TESTOSTERONE: Testosterone: 990 ng/dL — ABNORMAL HIGH (ref 264–916)

## 2023-12-04 LAB — PROLACTIN: Prolactin: 6.3 ng/mL (ref 3.9–22.7)

## 2023-12-04 LAB — HEMOGLOBIN AND HEMATOCRIT, BLOOD
Hematocrit: 41.5 % (ref 37.5–51.0)
Hemoglobin: 14.3 g/dL (ref 13.0–17.7)

## 2023-12-04 LAB — ESTRADIOL: Estradiol: 35.5 pg/mL (ref 7.6–42.6)

## 2023-12-08 ENCOUNTER — Encounter: Payer: Self-pay | Admitting: Urology

## 2024-01-19 ENCOUNTER — Other Ambulatory Visit: Payer: Self-pay

## 2024-01-19 DIAGNOSIS — E291 Testicular hypofunction: Secondary | ICD-10-CM

## 2024-01-20 ENCOUNTER — Other Ambulatory Visit

## 2024-01-20 DIAGNOSIS — E291 Testicular hypofunction: Secondary | ICD-10-CM

## 2024-01-21 ENCOUNTER — Ambulatory Visit: Payer: Self-pay | Admitting: Urology

## 2024-01-21 LAB — TESTOSTERONE: Testosterone: 1006 ng/dL — ABNORMAL HIGH (ref 264–916)

## 2024-01-21 LAB — HEMOGLOBIN: Hemoglobin: 16.2 g/dL (ref 13.0–17.7)

## 2024-01-21 LAB — HEMATOCRIT: Hematocrit: 49 % (ref 37.5–51.0)

## 2024-01-21 LAB — PROLACTIN: Prolactin: 16.8 ng/mL (ref 3.9–22.7)

## 2024-01-21 NOTE — Progress Notes (Signed)
    Assessment: Hypogonadism, symptomatic.  Now on repletion with adequate peak levels of testosterone    Plan: Continue injecting 100 mg of testosterone  cypionate on a weekly basis  I will see back in 6 months following laboratories which will hopefully be drawn on a trough date  History of Present Illness:  Phillip Cabrera is a 33 y.o. male who is seen in consultation from [facility], Lauren A, NP for evaluation of low testosterone  levels.  Most recent testosterone  levels drawn appropriately before 10 AM several days apart in March, 2025.  Levels were 266 and 263, respectively.  6.9.2025: Here today for recheck.  Most recent peak testosterone  level just over thousand.  He feels like the testosterone  is helping some with the strength and sleeping.  No injection difficulties.   Past Medical History:  Past Medical History:  Diagnosis Date   History of hand fracture     Past Surgical History:  Past Surgical History:  Procedure Laterality Date   HAND SURGERY      Allergies:  No Known Allergies  Family History:  Family History  Problem Relation Age of Onset   Cancer Maternal Aunt        spinal   Cancer Paternal Grandmother        breast    Social History:  Social History   Tobacco Use   Smoking status: Never   Smokeless tobacco: Former    Types: Snuff    Quit date: 11/12/2018  Vaping Use   Vaping status: Never Used  Substance Use Topics   Alcohol use: Not Currently    Comment: Stopped in 2020   Drug use: No      Results: Old urology note reviewed  Testosterone  levels reviewed

## 2024-01-26 ENCOUNTER — Ambulatory Visit (INDEPENDENT_AMBULATORY_CARE_PROVIDER_SITE_OTHER): Admitting: Urology

## 2024-01-26 VITALS — BP 118/66 | HR 74 | Ht 69.0 in | Wt 210.0 lb

## 2024-01-26 DIAGNOSIS — E291 Testicular hypofunction: Secondary | ICD-10-CM

## 2024-01-30 ENCOUNTER — Other Ambulatory Visit: Payer: Self-pay | Admitting: Urology

## 2024-01-30 DIAGNOSIS — E291 Testicular hypofunction: Secondary | ICD-10-CM

## 2024-03-29 ENCOUNTER — Telehealth: Payer: Self-pay

## 2024-03-29 NOTE — Telephone Encounter (Signed)
 Copied from CRM [id number]. Topic: Clinical - Request for Lab/Test Order >> Mar 29, 2024 10:19 AM Mesmerise C wrote: Reason for CRM: Patient is requesting tests be done for hemogloblin levels to test for red blood cells, testosterone , and hemocrat, estrol, and proactin also inquiring during his lab will his BP >> Mar 29, 2024 10:27 AM Martinique E wrote: Patient would also like to add HDL and LDL to this list.

## 2024-04-01 ENCOUNTER — Telehealth: Payer: Self-pay

## 2024-04-01 ENCOUNTER — Ambulatory Visit: Admitting: Nurse Practitioner

## 2024-04-01 NOTE — Telephone Encounter (Signed)
 Noted

## 2024-04-01 NOTE — Telephone Encounter (Unsigned)
 Copied from CRM #[id number]. Topic: General - Running Late >> Apr 01, 2024  8:22 AM Ismael A wrote: Patient's mother called in to reschedule appt for hemogloblin levels to test for red blood cells, testosterone , and hemocrat, estrol, proactin,HDL and LDL TESTING Patient No Show for appt 08/14 8:00AM - after going over availability for provider she stated that they will call back at a later time to reschedule

## 2024-04-02 ENCOUNTER — Encounter: Payer: Self-pay | Admitting: Nurse Practitioner

## 2024-05-21 ENCOUNTER — Telehealth: Admitting: Physician Assistant

## 2024-05-21 ENCOUNTER — Ambulatory Visit: Payer: Self-pay

## 2024-05-21 ENCOUNTER — Other Ambulatory Visit: Payer: Self-pay | Admitting: Urology

## 2024-05-21 DIAGNOSIS — M778 Other enthesopathies, not elsewhere classified: Secondary | ICD-10-CM

## 2024-05-21 DIAGNOSIS — J019 Acute sinusitis, unspecified: Secondary | ICD-10-CM | POA: Diagnosis not present

## 2024-05-21 DIAGNOSIS — E291 Testicular hypofunction: Secondary | ICD-10-CM

## 2024-05-21 DIAGNOSIS — B9689 Other specified bacterial agents as the cause of diseases classified elsewhere: Secondary | ICD-10-CM

## 2024-05-21 MED ORDER — MELOXICAM 15 MG PO TABS: 15.0000 mg | ORAL_TABLET | Freq: Every day | ORAL | 0 refills | Status: AC

## 2024-05-21 MED ORDER — AMOXICILLIN-POT CLAVULANATE 875-125 MG PO TABS: 1.0000 | ORAL_TABLET | Freq: Two times a day (BID) | ORAL | 0 refills | Status: DC

## 2024-05-21 NOTE — Patient Instructions (Signed)
 Steven D Thorley, thank you for joining Delon CHRISTELLA Dickinson, PA-C for today's virtual visit.  While this provider is not your primary care provider (PCP), if your PCP is located in our provider database this encounter information will be shared with them immediately following your visit.   A [facility] MyChart account gives you access to today's visit and all your visits, tests, and labs performed at [facility]  click here if you don't have a [facility] MyChart account or go to [email or web address]  Consent: (Patient) Phillip Cabrera provided verbal consent for this virtual visit at the beginning of the encounter.  Current Medications:  Current Outpatient Medications:    amoxicillin-clavulanate (AUGMENTIN) 875-125 MG tablet, Take 1 tablet by mouth 2 (two) times daily., Disp: 14 tablet, Rfl: 0   meloxicam  (MOBIC ) 15 MG tablet, Take 1 tablet (15 mg total) by mouth daily., Disp: 30 tablet, Rfl: 0   albuterol  (VENTOLIN  HFA) 108 (90 Base) MCG/ACT inhaler, INHALE 2 PUFFS INTO THE LUNGS EVERY 6 HOURS AS NEEDED FOR WHEEZING OR SHORTNESS OF BREATH (Patient not taking: Reported on 01/26/2024), Disp: 8 g, Rfl: 2   cetirizine (ZYRTEC) 10 MG tablet, Take 10 mg by mouth daily., Disp: , Rfl:    testosterone  cypionate (DEPOTESTOSTERONE CYPIONATE) 200 MG/ML injection, ADMINISTER 0.5 ML(100 MG) IN THE MUSCLE EVERY 7 DAYS, Disp: 6 mL, Rfl: 1   Medications ordered in this encounter:  Meds ordered this encounter  Medications   amoxicillin-clavulanate (AUGMENTIN) 875-125 MG tablet    Sig: Take 1 tablet by mouth 2 (two) times daily.    Dispense:  14 tablet    Refill:  0    Supervising Provider:   BLAISE ALEENE KIDD [[id number]]   meloxicam  (MOBIC ) 15 MG tablet    Sig: Take 1 tablet (15 mg total) by mouth daily.    Dispense:  30 tablet    Refill:  0    Supervising Provider:   LAMPTEY, PHILIP O [[id number]]     *If you need refills on other medications prior to your next appointment, please contact  your pharmacy*  Follow-Up: Call back or seek an in-person evaluation if the symptoms worsen or if the condition fails to improve as anticipated.  [facility] [phone]  Other Instructions  Sinus Infection, Adult A sinus infection, also called sinusitis, is inflammation of your sinuses. Sinuses are hollow spaces in the bones around your face. Your sinuses are located: Around your eyes. In the middle of your forehead. Behind your nose. In your cheekbones. Mucus normally drains out of your sinuses. When your nasal tissues become inflamed or swollen, mucus can become trapped or blocked. This allows bacteria, viruses, and fungi to grow, which leads to infection. Most infections of the sinuses are caused by a virus. A sinus infection can develop quickly. It can last for up to 4 weeks (acute) or for more than 12 weeks (chronic). A sinus infection often develops after a cold. What are the causes? This condition is caused by anything that creates swelling in the sinuses or stops mucus from draining. This includes: Allergies. Asthma. Infection from bacteria or viruses. Deformities or blockages in your nose or sinuses. Abnormal growths in the nose (nasal polyps). Pollutants, such as chemicals or irritants in the air. Infection from fungi. This is rare. What increases the risk? You are more likely to develop this condition if you: Have a weak body defense system (immune system). Do a lot of swimming or diving. Overuse nasal sprays. Smoke.  What are the signs or symptoms? The main symptoms of this condition are pain and a feeling of pressure around the affected sinuses. Other symptoms include: Stuffy nose or congestion that makes it difficult to breathe through your nose. Thick yellow or greenish drainage from your nose. Tenderness, swelling, and warmth over the affected sinuses. A cough that may get worse at night. Decreased sense of smell and taste. Extra mucus that  collects in the throat or the back of the nose (postnasal drip) causing a sore throat or bad breath. Tiredness (fatigue). Fever. How is this diagnosed? This condition is diagnosed based on: Your symptoms. Your medical history. A physical exam. Tests to find out if your condition is acute or chronic. This may include: Checking your nose for nasal polyps. Viewing your sinuses using a device that has a light (endoscope). Testing for allergies or bacteria. Imaging tests, such as an MRI or CT scan. In rare cases, a bone biopsy may be done to rule out more serious types of fungal sinus disease. How is this treated? Treatment for a sinus infection depends on the cause and whether your condition is chronic or acute. If caused by a virus, your symptoms should go away on their own within 10 days. You may be given medicines to relieve symptoms. They include: Medicines that shrink swollen nasal passages (decongestants). A spray that eases inflammation of the nostrils (topical intranasal corticosteroids). Rinses that help get rid of thick mucus in your nose (nasal saline washes). Medicines that treat allergies (antihistamines). Over-the-counter pain relievers. If caused by bacteria, your health care provider may recommend waiting to see if your symptoms improve. Most bacterial infections will get better without antibiotic medicine. You may be given antibiotics if you have: A severe infection. A weak immune system. If caused by narrow nasal passages or nasal polyps, surgery may be needed. Follow these instructions at home: Medicines Take, use, or apply over-the-counter and prescription medicines only as told by your health care provider. These may include nasal sprays. If you were prescribed an antibiotic medicine, take it as told by your health care provider. Do not stop taking the antibiotic even if you start to feel better. Hydrate and humidify  Drink enough fluid to keep your urine pale yellow.  Staying hydrated will help to thin your mucus. Use a cool mist humidifier to keep the humidity level in your home above 50%. Inhale steam for 10-15 minutes, 3-4 times a day, or as told by your health care provider. You can do this in the bathroom while a hot shower is running. Limit your exposure to cool or dry air. Rest Rest as much as possible. Sleep with your head raised (elevated). Make sure you get enough sleep each night. General instructions  Apply a warm, moist washcloth to your face 3-4 times a day or as told by your health care provider. This will help with discomfort. Use nasal saline washes as often as told by your health care provider. Wash your hands often with soap and water to reduce your exposure to germs. If soap and water are not available, use hand sanitizer. Do not smoke. Avoid being around people who are smoking (secondhand smoke). Keep all follow-up visits. This is important. Contact a health care provider if: You have a fever. Your symptoms get worse. Your symptoms do not improve within 10 days. Get help right away if: You have a severe headache. You have persistent vomiting. You have severe pain or swelling around your face or eyes.  You have vision problems. You develop confusion. Your neck is stiff. You have trouble breathing. These symptoms may be an emergency. Get help right away. Call 911. Do not wait to see if the symptoms will go away. Do not drive yourself to the hospital. Summary A sinus infection is soreness and inflammation of your sinuses. Sinuses are hollow spaces in the bones around your face. This condition is caused by nasal tissues that become inflamed or swollen. The swelling traps or blocks the flow of mucus. This allows bacteria, viruses, and fungi to grow, which leads to infection. If you were prescribed an antibiotic medicine, take it as told by your health care provider. Do not stop taking the antibiotic even if you start to feel  better. Keep all follow-up visits. This is important. This information is not intended to replace advice given to you by your health care provider. Make sure you discuss any questions you have with your health care provider. Document Revised: 07/10/2021 Document Reviewed: 07/10/2021 Elsevier Patient Education  2024 Elsevier Inc.   If you have been instructed to have an in-person evaluation today at a local Urgent Care facility, please use the link below. It will take you to a list of all of our available [facility], including address, phone number and hours of operation. Please do not delay care.  [facility]  If you or a family member do not have a primary care provider, use the link below to schedule a visit and establish care. When you choose a [facility] primary care physician or advanced practice provider, you gain a long-term partner in health. Find a Primary Care Provider  Learn more about [facility]'s in-office and virtual care options: [facility] - Get Care Now

## 2024-05-21 NOTE — Progress Notes (Signed)
 Virtual Visit Consent   Phillip Cabrera, you are scheduled for a virtual visit with a [facility] provider today. Just as with appointments in the office, your consent must be obtained to participate. Your consent will be active for this visit and any virtual visit you may have with one of our providers in the next 365 days. If you have a MyChart account, a copy of this consent can be sent to you electronically.  As this is a virtual visit, video technology does not allow for your provider to perform a traditional examination. This may limit your provider's ability to fully assess your condition. If your provider identifies any concerns that need to be evaluated in person or the need to arrange testing (such as labs, EKG, etc.), we will make arrangements to do so. Although advances in technology are sophisticated, we cannot ensure that it will always work on either your end or our end. If the connection with a video visit is poor, the visit may have to be switched to a telephone visit. With either a video or telephone visit, we are not always able to ensure that we have a secure connection.  By engaging in this virtual visit, you consent to the provision of healthcare and authorize for your insurance to be billed (if applicable) for the services provided during this visit. Depending on your insurance coverage, you may receive a charge related to this service.  I need to obtain your verbal consent now. Are you willing to proceed with your visit today? Phillip Cabrera has provided verbal consent on 05/21/2024 for a virtual visit (video or telephone). Phillip CHRISTELLA Dickinson, PA-C  Date: 05/21/2024 4:40 PM   Virtual Visit via Video Note   I, Phillip Cabrera, connected with  Phillip Cabrera  ([id number], 1991/05/25) on 05/21/24 at  4:30 PM EDT by a video-enabled telemedicine application and verified that I am speaking with the correct person using two identifiers.  Location: Patient: Virtual Visit Location  Patient: Mobile Provider: Virtual Visit Location Provider: Home Office   I discussed the limitations of evaluation and management by telemedicine and the availability of in person appointments. The patient expressed understanding and agreed to proceed.    History of Present Illness: Phillip Cabrera is a 33 y.o. who identifies as a male who was assigned male at birth, and is being seen today for sinus pain.  HPI: Sinusitis This is a new problem. The current episode started in the past 7 days. The problem has been gradually worsening since onset. There has been no fever. The pain is moderate. Associated symptoms include congestion, headaches, shortness of breath (wit lying flat and congestion), sinus pressure (right) and a sore throat (scratchy from drainage). Pertinent negatives include no chills, coughing, diaphoresis or ear pain. (Thick brown mucus) Treatments tried: Mucinex DM, dayquil. The treatment provided no relief.    Also, having a flare of left elbow tendinitis from weight lifting. Has had prior in his right and used Meloxicam  successfully. He would like a refill of this.   Problems:  Patient Active Problem List   Diagnosis Date Noted   Chest wall pain 11/14/2023   Routine general medical examination at a health care facility 10/27/2023   Obesity (BMI 30-39.9) 10/27/2023   Lump of skin of back 09/23/2021   Chronic right shoulder pain 09/23/2021   Right elbow pain 09/23/2021    Allergies: No Known Allergies Medications:  Current Outpatient Medications:    amoxicillin-clavulanate (AUGMENTIN) 875-125 MG tablet,  Take 1 tablet by mouth 2 (two) times daily., Disp: 14 tablet, Rfl: 0   meloxicam  (MOBIC ) 15 MG tablet, Take 1 tablet (15 mg total) by mouth daily., Disp: 30 tablet, Rfl: 0   albuterol  (VENTOLIN  HFA) 108 (90 Base) MCG/ACT inhaler, INHALE 2 PUFFS INTO THE LUNGS EVERY 6 HOURS AS NEEDED FOR WHEEZING OR SHORTNESS OF BREATH (Patient not taking: Reported on 01/26/2024), Disp: 8 g, Rfl:  2   cetirizine (ZYRTEC) 10 MG tablet, Take 10 mg by mouth daily., Disp: , Rfl:    testosterone  cypionate (DEPOTESTOSTERONE CYPIONATE) 200 MG/ML injection, ADMINISTER 0.5 ML(100 MG) IN THE MUSCLE EVERY 7 DAYS, Disp: 6 mL, Rfl: 1  Observations/Objective: Patient is well-developed, well-nourished in no acute distress.  Resting comfortably  Head is normocephalic, atraumatic.  No labored breathing.  Speech is clear and coherent with logical content.  Patient is alert and oriented at baseline.    Assessment and Plan: 1. Acute bacterial sinusitis (Primary) - amoxicillin-clavulanate (AUGMENTIN) 875-125 MG tablet; Take 1 tablet by mouth 2 (two) times daily.  Dispense: 14 tablet; Refill: 0  2. Left elbow tendinitis - meloxicam  (MOBIC ) 15 MG tablet; Take 1 tablet (15 mg total) by mouth daily.  Dispense: 30 tablet; Refill: 0  - Worsening sinus symptoms that have not responded to OTC medications.  - Will give Augmentin - Continue allergy medications.  - Steam and humidifier can help - Stay well hydrated and get plenty of rest.   - Meloxicam  refilled for left elbow pain  - Seek in person evaluation if no symptom improvement or if symptoms worsen   Follow Up Instructions: I discussed the assessment and treatment plan with the patient. The patient was provided an opportunity to ask questions and all were answered. The patient agreed with the plan and demonstrated an understanding of the instructions.  A copy of instructions were sent to the patient via MyChart unless otherwise noted below.    The patient was advised to call back or seek an in-person evaluation if the symptoms worsen or if the condition fails to improve as anticipated.    Phillip CHRISTELLA Dickinson, PA-C

## 2024-05-21 NOTE — Telephone Encounter (Signed)
 FYI Only or Action Required?: FYI only for provider.  Patient was last seen in primary care on 11/14/2023 by Thedora Garnette HERO, MD.  Called Nurse Triage reporting Sinusitis.  Symptoms began several days ago.  Interventions attempted: OTC medications: mucinex.  Symptoms are: gradually worsening.  Triage Disposition: See PCP When Office is Open (Within 3 Days)  Patient/caregiver understands and will follow disposition?: Yes  Copied from CRM [id number]. Topic: Clinical - Red Word Triage >> May 21, 2024  2:45 PM Viola F wrote: Patient has believes he has sinus infection and would like an antibiotic sent to pharmacy. Since Sunday, he's been having a runny nose..brown/yellow discharge, congestion, shortness of breath when laying down, and sore throat.  Reason for Disposition  [1] Sinus congestion (pressure, fullness) AND [2] present > 10 days    Ongoing since 9/28. Pt reports having sinus infections in the past. Reports current symptoms more severe compared to past sinus infections. Pt requesting to be seen by provider.  Answer Assessment - Initial Assessment Questions 1. LOCATION: Where does it hurt?      Sinus congestion  2. ONSET: When did the sinus pain start?  (e.g., hours, days)      Sunday  3. SEVERITY: How bad is the pain?   (Scale 0-10; or none, mild, moderate or severe)     No pain. Minor headache.  4. RECURRENT SYMPTOM: Have you ever had sinus problems before? If Yes, ask: When was the last time? and What happened that time?      Yes, prescribed an antibiotic  5. NASAL CONGESTION: Is the nose blocked? If Yes, ask: Can you open it or must you breathe through your mouth?     Mostly blocked, sometimes able to breathe out of left nostril.  6. NASAL DISCHARGE: Do you have discharge from your nose? If so ask, What color?     Brown/yellow.  7. FEVER: Do you have a fever? If Yes, ask: What is it, how was it measured, and when did it start?      No  fever.  8. OTHER SYMPTOMS: Do you have any other symptoms? (e.g., sore throat, cough, earache, difficulty breathing)     Sore throat, runny nose.  Protocols used: Sinus Pain or Congestion-A-AH

## 2024-07-23 ENCOUNTER — Other Ambulatory Visit: Payer: Self-pay

## 2024-07-23 ENCOUNTER — Other Ambulatory Visit

## 2024-07-23 DIAGNOSIS — E291 Testicular hypofunction: Secondary | ICD-10-CM

## 2024-07-24 LAB — TESTOSTERONE,FREE AND TOTAL
Testosterone, Free: 29.7 pg/mL — ABNORMAL HIGH (ref 8.7–25.1)
Testosterone: 694 ng/dL (ref 264–916)

## 2024-07-24 LAB — HEMATOCRIT: Hematocrit: 48.7 % (ref 37.5–51.0)

## 2024-07-25 ENCOUNTER — Ambulatory Visit: Payer: Self-pay | Admitting: Urology

## 2024-07-27 NOTE — Progress Notes (Unsigned)
    Assessment: Hypogonadism, symptomatic.  Now on repletion with adequate peak levels of testosterone    Plan: Continue injecting 100 mg of testosterone  cypionate on a weekly basis  I will see back in 6 months following laboratories which will hopefully be drawn on a trough date  History of Present Illness:  Phillip Cabrera is a 33 y.o. male who is seen in consultation from [facility], Lauren A, NP for evaluation of low testosterone  levels.  Most recent testosterone  levels drawn appropriately before 10 AM several days apart in March, 2025.  Levels were 266 and 263, respectively.  6.9.2025: Here today for recheck.  Most recent peak testosterone  level just over thousand.  He feels like the testosterone  is helping some with the strength and sleeping.  No injection difficulties.  12.10.2025: HEre for scheduled f/u. Labs (drawn 12/5)- Total T---694 Free T--30 Hct--48.7  Past Medical History:  Past Medical History:  Diagnosis Date   History of hand fracture     Past Surgical History:  Past Surgical History:  Procedure Laterality Date   HAND SURGERY      Allergies:  No Known Allergies  Family History:  Family History  Problem Relation Age of Onset   Cancer Maternal Aunt        spinal   Cancer Paternal Grandmother        breast    Social History:  Social History   Tobacco Use   Smoking status: Never   Smokeless tobacco: Former    Types: Snuff    Quit date: 11/12/2018  Vaping Use   Vaping status: Never Used  Substance Use Topics   Alcohol use: Not Currently    Comment: Stopped in 2020   Drug use: No      Results: Old urology note reviewed  Testosterone  levels reviewed

## 2024-07-28 ENCOUNTER — Ambulatory Visit: Payer: Self-pay

## 2024-07-28 ENCOUNTER — Ambulatory Visit: Admitting: Urology

## 2024-07-28 VITALS — BP 120/58 | HR 62 | Ht 69.0 in | Wt 210.0 lb

## 2024-07-28 DIAGNOSIS — E291 Testicular hypofunction: Secondary | ICD-10-CM

## 2024-07-28 NOTE — Telephone Encounter (Signed)
 FYI Only or Action Required?: FYI only for provider: recommended to urgent care.  Patient was last seen in primary care on 05/21/2024 by Vivienne Delon HERO, PA-C.  Called Nurse Triage reporting Back Pain.  Symptoms began several days ago.  Interventions attempted: OTC medications: Tylenol , ibuprofen and Rest, hydration, or home remedies.  Symptoms are: unchanged.  Triage Disposition: See HCP Within 4 Hours (Or PCP Triage)  Patient/caregiver understands and will follow disposition?: Yes  Copied from CRM #[id number]. Topic: Clinical - Red Word Triage >> Jul 28, 2024 12:39 PM Rea ORN wrote: Red Word that prompted transfer to Nurse Triage: back pain due to moving furniture the past few days. Pt stated his back, abs and obliques hurt and he can not stand up straight. Reason for Disposition  [1] SEVERE back pain (e.g., excruciating, unable to do any normal activities) AND [2] not improved 2 hours after pain medicine  Answer Assessment - Initial Assessment Questions Patient reports severe mid to low back pain. Recommended to Urgent Care to be seen today.   1. ONSET: When did the pain begin? (e.g., minutes, hours, days)     Started a couple of day ago 2. LOCATION: Where does it hurt? (upper, mid or lower back)     Mid to lower back  3. SEVERITY: How bad is the pain?  (e.g., Scale 1-10; mild, moderate, or severe)     10 4. PATTERN: Is the pain constant? (e.g., yes, no; constant, intermittent)      constant 5. RADIATION: Does the pain shoot into your legs or somewhere else?     Radiates into abdominal area 6. CAUSE:  What do you think is causing the back pain?      unsure 7. BACK OVERUSE:  Any recent lifting of heavy objects, strenuous work or exercise?     yes 8. MEDICINES: What have you taken so far for the pain? (e.g., nothing, acetaminophen , NSAIDS)     Ibuprofen, tylenol  9. NEUROLOGIC SYMPTOMS: Do you have any weakness, numbness, or problems with bowel/bladder  control?     no 10. OTHER SYMPTOMS: Do you have any other symptoms? (e.g., fever, abdomen pain, burning with urination, blood in urine)       Abdominal pain  Protocols used: Back Pain-A-AH

## 2024-07-30 NOTE — Telephone Encounter (Signed)
 Noted. Dm/cma

## 2024-08-06 ENCOUNTER — Encounter: Payer: Self-pay | Admitting: Urology

## 2024-09-03 ENCOUNTER — Encounter: Payer: Self-pay | Admitting: Nurse Practitioner

## 2024-09-03 ENCOUNTER — Ambulatory Visit: Admitting: Nurse Practitioner

## 2024-09-03 VITALS — BP 118/74 | HR 70 | Temp 97.7°F | Ht 69.0 in | Wt 212.6 lb

## 2024-09-03 DIAGNOSIS — L509 Urticaria, unspecified: Secondary | ICD-10-CM

## 2024-09-03 MED ORDER — CETIRIZINE HCL 10 MG PO TABS: 10.0000 mg | ORAL_TABLET | Freq: Every day | ORAL | 3 refills | Status: AC

## 2024-09-03 MED ORDER — TRIAMCINOLONE ACETONIDE 0.1 % EX CREA: 1.0000 | TOPICAL_CREAM | Freq: Two times a day (BID) | CUTANEOUS | 0 refills | Status: AC

## 2024-09-03 NOTE — Progress Notes (Signed)
 "  Acute Visit  BP 118/74 (BP Location: Left Arm, Patient Position: Sitting, Cuff Size: Large)   Pulse 70   Temp 97.7 F (36.5 C)   Ht 5' 9 (1.753 m)   Wt 212 lb 9.6 oz (96.4 kg)   SpO2 95%   BMI 31.40 kg/m    Subjective:    Patient ID: Phillip Cabrera, male    DOB: October 16, 1990, 34 y.o.   MRN: [id number]  CC: Chief Complaint  Patient presents with   Rash    All over body with itching and no pain    HPI: Phillip Cabrera is a 34 y.o. male presents for the development of a full body rash.  Discussed the use of AI scribe software for clinical note transcription with the patient, who gave verbal consent to proceed.  He has a chronic, pruritic rash on his shoulders, neck, and inner legs that recurred about six months after stopping a previously effective topical steroid cream, triamcinolone . The rash worsens with cold weather, with increased itch over the tops of his shoulders. He has not started any new shampoos, soaps, detergents, or foods. He is on testosterone  but does not associate it with the rash as the rash was present before he started. He is not using oral antihistamines. Symptoms improve when he is away from his home where cats and dogs are present. He denies fever and shortness of breath.      Past Medical History:  Diagnosis Date   History of hand fracture     Past Surgical History:  Procedure Laterality Date   HAND SURGERY      Family History  Problem Relation Age of Onset   Cancer Maternal Aunt        spinal   Cancer Paternal Grandmother        breast     Social History[1]  Medications Ordered Prior to Encounter[2]   Review of Systems See pertinent positives and negatives per HPI.     Objective:    BP 118/74 (BP Location: Left Arm, Patient Position: Sitting, Cuff Size: Large)   Pulse 70   Temp 97.7 F (36.5 C)   Ht 5' 9 (1.753 m)   Wt 212 lb 9.6 oz (96.4 kg)   SpO2 95%   BMI 31.40 kg/m   Wt Readings from Last 3 Encounters:  09/03/24 212 lb  9.6 oz (96.4 kg)  07/28/24 210 lb (95.3 kg)  01/26/24 210 lb (95.3 kg)    BP Readings from Last 3 Encounters:  09/03/24 118/74  07/28/24 (!) 120/58  01/26/24 118/66    Physical Exam Vitals and nursing note reviewed.  Constitutional:      Appearance: Normal appearance.  HENT:     Head: Normocephalic.  Eyes:     Conjunctiva/sclera: Conjunctivae normal.  Pulmonary:     Effort: Pulmonary effort is normal.  Musculoskeletal:     Cervical back: Normal range of motion.  Skin:    General: Skin is warm.     Findings: Rash (papular rash over abdomen, shoulder, arms, inner legs, neck) present.  Neurological:     General: No focal deficit present.     Mental Status: He is alert and oriented to person, place, and time.  Psychiatric:        Mood and Affect: Mood normal.        Behavior: Behavior normal.        Thought Content: Thought content normal.        Judgment: Judgment normal.  Assessment & Plan:   Problem List Items Addressed This Visit       Musculoskeletal and Integument   Hives - Primary   Chronic, ongoing. He experiences recurrent rash on shoulders, neck, and inner legs, worsened by cold weather. Triamcinolone  cream was previously effective, but the rash recurred after stopping. There is a possible allergic reaction, as symptoms improve away from home. He declined prednisone. Restart Zyrtec  or Claritin once daily. Prescribe triamcinolone  cream for topical use twice daily as needed. Refer to allergist for evaluation and dermatology per request. Check CBC, food allergy panel, environmental allergy panel, RPR. Advise Benadryl  at night if itching is severe.       Relevant Orders   Ambulatory referral to Allergy   Ambulatory referral to Dermatology   RESPIRATORY ALLERGY PANEL REGION II W/ RFLX: [place]   Food Allergy Profile   CBC with Differential/Platelet   RPR     Follow up plan: Return if symptoms worsen or fail to improve.  Tinnie DELENA Harada,  NP  I,Emily Lagle,acting as a scribe for [employer], NP.,have documented all relevant documentation on the behalf of [name] DELENA Harada, NP.  I, Tinnie DELENA Harada, NP, have reviewed all documentation for this visit. The documentation on 09/03/2024 for the exam, diagnosis, procedures, and orders are all accurate and complete.     [1]  Social History Tobacco Use   Smoking status: Never   Smokeless tobacco: Former    Types: Snuff    Quit date: 11/12/2018  Vaping Use   Vaping status: Never Used  Substance Use Topics   Alcohol use: Not Currently    Comment: Stopped in 2020   Drug use: No  [2]  Current Outpatient Medications on File Prior to Visit  Medication Sig Dispense Refill   meloxicam  (MOBIC ) 15 MG tablet Take 1 tablet (15 mg total) by mouth daily. (Patient taking differently: Take 15 mg by mouth as needed.) 30 tablet 0   testosterone  cypionate (DEPOTESTOSTERONE CYPIONATE) 200 MG/ML injection ADMINISTER 0.5 ML(100 MG) IN THE MUSCLE EVERY 7 DAYS 6 mL 1   albuterol  (VENTOLIN  HFA) 108 (90 Base) MCG/ACT inhaler INHALE 2 PUFFS INTO THE LUNGS EVERY 6 HOURS AS NEEDED FOR WHEEZING OR SHORTNESS OF BREATH (Patient not taking: Reported on 09/03/2024) 8 g 2   No current facility-administered medications on file prior to visit.   "

## 2024-09-03 NOTE — Assessment & Plan Note (Signed)
 Chronic, ongoing. He experiences recurrent rash on shoulders, neck, and inner legs, worsened by cold weather. Triamcinolone  cream was previously effective, but the rash recurred after stopping. There is a possible allergic reaction, as symptoms improve away from home. He declined prednisone. Restart Zyrtec  or Claritin once daily. Prescribe triamcinolone  cream for topical use twice daily as needed. Refer to allergist for evaluation and dermatology per request. Check CBC, food allergy  panel, environmental allergy  panel, RPR. Advise Benadryl  at night if itching is severe.

## 2024-09-03 NOTE — Patient Instructions (Signed)
 It was great to see you!  Start zyrtec  or claritin 1 tablet daily  We are checking your labs today and will let you know the results via mychart/phone.   I have placed a referral to an allergist and dermatologist   Start triamcinolone  cream twice a day as needed to affected areas   Let's follow-up if systems worsen or don't improve   Take care,  Tinnie Harada, NP

## 2024-09-07 ENCOUNTER — Ambulatory Visit: Payer: Self-pay | Admitting: Nurse Practitioner

## 2024-09-07 DIAGNOSIS — L509 Urticaria, unspecified: Secondary | ICD-10-CM

## 2024-09-07 LAB — FOOD ALLERGY PROFILE
"Macadamia Nut ": 0.16 kU/L — ABNORMAL HIGH
"Sesame Seed f10 ": 0.17 kU/L — ABNORMAL HIGH
Allergen, Salmon, f41: <0.1 kU/L
Almonds: <0.1 kU/L
Brazil Nut: <0.1 kU/L
CLASS: 0
CLASS: 0
CLASS: 0
CLASS: 0
CLASS: 0
CLASS: 0
CLASS: 0
CLASS: 0
CLASS: 0
Cashew IgE: <0.1 kU/L
Class: 0
Class: 0
Class: 0
Class: 0
Egg White IgE: <0.1 kU/L
Fish Cod: <0.1 kU/L
Hazelnut: <0.1 kU/L
Milk IgE: <0.1 kU/L
Peanut IgE: 0.24 kU/L — ABNORMAL HIGH
Scallop IgE: <0.1 kU/L
Shrimp IgE: <0.1 kU/L
Soybean IgE: <0.1 kU/L
Tuna IgE: <0.1 kU/L
Walnut: <0.1 kU/L
Wheat IgE: 0.28 kU/L — ABNORMAL HIGH

## 2024-09-07 LAB — RESPIRATORY ALLERGY PANEL REGION II W/ RFLX: ~~LOC~~
Allergen, A. alternata, m6: <0.1 kU/L
Allergen, Cedar tree, t12: <0.1 kU/L
Allergen, Comm Silver Birch, t9: <0.1 kU/L
Allergen, Cottonwood, t14: <0.1 kU/L
Allergen, D pternoyssinus,d7: <0.1 kU/L
Allergen, Mouse Urine Protein, e78: <0.1 kU/L
Allergen, Mulberry, t76: <0.1 kU/L
Allergen, Oak,t7: <0.1 kU/L
Allergen, P. notatum, m1: <0.1 kU/L
Aspergillus fumigatus, m3: <0.1 kU/L
Bermuda Grass: 3.18 kU/L — ABNORMAL HIGH
Box Elder IgE: <0.1 kU/L
CLADOSPORIUM HERBARUM (M2) IGE: <0.1 kU/L
COMMON RAGWEED (SHORT) (W1) IGE: 0.24 kU/L — ABNORMAL HIGH
Cat Dander: <0.1 kU/L
Class: 0
Class: 0
Class: 0
Class: 0
Class: 0
Class: 0
Class: 0
Class: 0
Class: 0
Class: 0
Class: 0
Class: 0
Class: 0
Class: 0
Class: 0
Class: 0
Class: 0
Class: 0
Class: 0
Class: 1
Class: 2
Class: 2
Class: 3
Cockroach: <0.1 kU/L
D. farinae: <0.1 kU/L
Dog Dander: <0.1 kU/L
Elm IgE: <0.1 kU/L
IgE (Immunoglobulin E), Serum: 43 kU/L
Johnson Grass: 3.25 kU/L — ABNORMAL HIGH
Pecan/Hickory Tree IgE: 0.68 kU/L — ABNORMAL HIGH
Rough Pigweed  IgE: <0.1 kU/L
Sheep Sorrel IgE: <0.1 kU/L
Timothy Grass: 7.37 kU/L — ABNORMAL HIGH

## 2024-09-07 LAB — CBC WITH DIFFERENTIAL/PLATELET
Absolute Lymphocytes: 2612 {cells}/uL (ref 850–3900)
Absolute Monocytes: 651 {cells}/uL (ref 200–950)
Basophils Absolute: 37 {cells}/uL (ref 0–200)
Basophils Relative: 0.5 %
Eosinophils Absolute: 259 {cells}/uL (ref 15–500)
Eosinophils Relative: 3.5 %
HCT: 45.9 % (ref 39.4–51.1)
Hemoglobin: 15 g/dL (ref 13.2–17.1)
MCH: 29.4 pg (ref 27.0–33.0)
MCHC: 32.7 g/dL (ref 31.6–35.4)
MCV: 89.8 fL (ref 81.4–101.7)
MPV: 10.1 fL (ref 7.5–12.5)
Monocytes Relative: 8.8 %
Neutro Abs: 3841 {cells}/uL (ref 1500–7800)
Neutrophils Relative %: 51.9 %
Platelets: 265 Thousand/uL (ref 140–400)
RBC: 5.11 Million/uL (ref 4.20–5.80)
RDW: 12.5 % (ref 11.0–15.0)
Total Lymphocyte: 35.3 %
WBC: 7.4 Thousand/uL (ref 3.8–10.8)

## 2024-09-07 LAB — TEST AUTHORIZATION

## 2024-09-07 LAB — PEANUT COMPONENT PANEL REFLEX
Ara h 1 (f422): <0.1 kU/L
Ara h 2 (f423): <0.1 kU/L
Ara h 3 (f424): <0.1 kU/L
Ara h 8 (f352): <0.1 kU/L
Ara h 9 (f427: <0.1 kU/L
F447-IgE Ara h 6: <0.1 kU/L

## 2024-09-07 LAB — INTERPRETATION:

## 2024-09-07 LAB — SYPHILIS: RPR W/REFLEX TO RPR TITER AND TREPONEMAL ANTIBODIES, TRADITIONAL SCREENING AND DIAGNOSIS ALGORITHM: RPR Ser Ql: NONREACTIVE

## 2024-09-21 ENCOUNTER — Other Ambulatory Visit: Payer: Self-pay | Admitting: Urology

## 2024-09-21 ENCOUNTER — Encounter: Payer: Self-pay | Admitting: Urology

## 2024-09-21 DIAGNOSIS — E291 Testicular hypofunction: Secondary | ICD-10-CM

## 2024-09-21 MED ORDER — CLOMIPHENE CITRATE 50 MG PO TABS: ORAL_TABLET | ORAL | 11 refills | Status: AC

## 2025-01-21 ENCOUNTER — Other Ambulatory Visit

## 2025-01-26 ENCOUNTER — Ambulatory Visit: Admitting: Urology

## 2025-06-13 ENCOUNTER — Ambulatory Visit: Admitting: Physician Assistant
